# Patient Record
Sex: Female | Born: 1970 | Race: White | Hispanic: No | Marital: Married | Smoking: Never smoker
Health system: Southern US, Community
[De-identification: ages and names within clinical notes are randomized; demographics above are authoritative.]

## PROBLEM LIST (undated history)

## (undated) DIAGNOSIS — I341 Nonrheumatic mitral (valve) prolapse: Secondary | ICD-10-CM

## (undated) DIAGNOSIS — R112 Nausea with vomiting, unspecified: Secondary | ICD-10-CM

## (undated) DIAGNOSIS — D6861 Antiphospholipid syndrome: Principal | ICD-10-CM

## (undated) DIAGNOSIS — Z9889 Other specified postprocedural states: Secondary | ICD-10-CM

## (undated) DIAGNOSIS — F32A Depression, unspecified: Secondary | ICD-10-CM

## (undated) DIAGNOSIS — F329 Major depressive disorder, single episode, unspecified: Secondary | ICD-10-CM

## (undated) DIAGNOSIS — G43909 Migraine, unspecified, not intractable, without status migrainosus: Secondary | ICD-10-CM

## (undated) HISTORY — PX: CAROTID ENDARTERECTOMY: SUR193

## (undated) HISTORY — DX: Antiphospholipid syndrome: D68.61

## (undated) HISTORY — PX: CHOLECYSTECTOMY: SHX55

## (undated) HISTORY — PX: ABDOMINOPLASTY: SUR9

## (undated) HISTORY — PX: VENTRICULOPERITONEAL SHUNT: SHX204

## (undated) HISTORY — DX: Migraine, unspecified, not intractable, without status migrainosus: G43.909

## (undated) HISTORY — PX: WISDOM TOOTH EXTRACTION: SHX21

## (undated) HISTORY — DX: Nonrheumatic mitral (valve) prolapse: I34.1

## (undated) HISTORY — PX: RHINOPLASTY: SUR1284

---

## 2001-05-19 ENCOUNTER — Other Ambulatory Visit: Admission: RE | Admit: 2001-05-19 | Discharge: 2001-05-19 | Payer: Self-pay | Admitting: Obstetrics & Gynecology

## 2002-02-21 ENCOUNTER — Inpatient Hospital Stay (HOSPITAL_COMMUNITY): Admission: AD | Admit: 2002-02-21 | Discharge: 2002-02-21 | Payer: Self-pay | Admitting: Obstetrics and Gynecology

## 2002-02-22 ENCOUNTER — Inpatient Hospital Stay (HOSPITAL_COMMUNITY): Admission: AD | Admit: 2002-02-22 | Discharge: 2002-02-25 | Payer: Self-pay | Admitting: Family Medicine

## 2002-03-24 ENCOUNTER — Other Ambulatory Visit: Admission: RE | Admit: 2002-03-24 | Discharge: 2002-03-24 | Payer: Self-pay | Admitting: Obstetrics and Gynecology

## 2002-10-05 ENCOUNTER — Encounter: Admission: RE | Admit: 2002-10-05 | Discharge: 2002-10-05 | Payer: Self-pay | Admitting: Obstetrics and Gynecology

## 2002-10-05 ENCOUNTER — Encounter: Payer: Self-pay | Admitting: Obstetrics and Gynecology

## 2003-05-31 ENCOUNTER — Other Ambulatory Visit: Admission: RE | Admit: 2003-05-31 | Discharge: 2003-05-31 | Payer: Self-pay | Admitting: Obstetrics and Gynecology

## 2004-07-18 ENCOUNTER — Emergency Department (HOSPITAL_COMMUNITY): Admission: EM | Admit: 2004-07-18 | Discharge: 2004-07-18 | Payer: Self-pay | Admitting: Family Medicine

## 2004-11-06 ENCOUNTER — Other Ambulatory Visit: Admission: RE | Admit: 2004-11-06 | Discharge: 2004-11-06 | Payer: Self-pay | Admitting: Obstetrics and Gynecology

## 2005-12-07 ENCOUNTER — Ambulatory Visit (HOSPITAL_COMMUNITY): Admission: RE | Admit: 2005-12-07 | Discharge: 2005-12-07 | Payer: Self-pay | Admitting: Gastroenterology

## 2005-12-31 ENCOUNTER — Ambulatory Visit (HOSPITAL_COMMUNITY): Admission: RE | Admit: 2005-12-31 | Discharge: 2005-12-31 | Payer: Self-pay | Admitting: Gastroenterology

## 2006-03-11 ENCOUNTER — Ambulatory Visit (HOSPITAL_COMMUNITY): Admission: RE | Admit: 2006-03-11 | Discharge: 2006-03-11 | Payer: Self-pay | Admitting: Gastroenterology

## 2006-06-13 ENCOUNTER — Emergency Department (HOSPITAL_COMMUNITY): Admission: EM | Admit: 2006-06-13 | Discharge: 2006-06-13 | Payer: Self-pay | Admitting: Emergency Medicine

## 2006-11-20 ENCOUNTER — Ambulatory Visit (HOSPITAL_COMMUNITY): Admission: RE | Admit: 2006-11-20 | Discharge: 2006-11-20 | Payer: Self-pay | Admitting: *Deleted

## 2006-11-20 ENCOUNTER — Encounter (INDEPENDENT_AMBULATORY_CARE_PROVIDER_SITE_OTHER): Payer: Self-pay | Admitting: *Deleted

## 2006-11-29 ENCOUNTER — Emergency Department (HOSPITAL_COMMUNITY): Admission: EM | Admit: 2006-11-29 | Discharge: 2006-11-29 | Payer: Self-pay | Admitting: Emergency Medicine

## 2007-05-28 ENCOUNTER — Ambulatory Visit: Payer: Self-pay | Admitting: Internal Medicine

## 2007-06-16 ENCOUNTER — Ambulatory Visit: Payer: Self-pay

## 2007-06-16 ENCOUNTER — Encounter: Payer: Self-pay | Admitting: Internal Medicine

## 2007-07-07 ENCOUNTER — Encounter: Payer: Self-pay | Admitting: Pulmonary Disease

## 2007-07-14 ENCOUNTER — Ambulatory Visit: Payer: Self-pay | Admitting: Pulmonary Disease

## 2007-07-14 ENCOUNTER — Encounter: Payer: Self-pay | Admitting: Pulmonary Disease

## 2007-07-14 DIAGNOSIS — G4710 Hypersomnia, unspecified: Secondary | ICD-10-CM | POA: Insufficient documentation

## 2007-08-01 ENCOUNTER — Ambulatory Visit (HOSPITAL_BASED_OUTPATIENT_CLINIC_OR_DEPARTMENT_OTHER): Admission: RE | Admit: 2007-08-01 | Discharge: 2007-08-01 | Payer: Self-pay | Admitting: Pulmonary Disease

## 2007-08-06 ENCOUNTER — Ambulatory Visit: Payer: Self-pay | Admitting: Pulmonary Disease

## 2007-09-24 ENCOUNTER — Telehealth: Payer: Self-pay | Admitting: Pulmonary Disease

## 2008-05-02 ENCOUNTER — Emergency Department (HOSPITAL_COMMUNITY): Admission: EM | Admit: 2008-05-02 | Discharge: 2008-05-02 | Payer: Self-pay | Admitting: Emergency Medicine

## 2008-05-10 ENCOUNTER — Ambulatory Visit: Payer: Self-pay | Admitting: Sports Medicine

## 2008-05-10 DIAGNOSIS — M79609 Pain in unspecified limb: Secondary | ICD-10-CM

## 2008-05-10 DIAGNOSIS — M21619 Bunion of unspecified foot: Secondary | ICD-10-CM

## 2008-05-10 DIAGNOSIS — M25569 Pain in unspecified knee: Secondary | ICD-10-CM

## 2008-08-05 IMAGING — CR DG ABDOMEN ACUTE W/ 1V CHEST
3 series · 3 of 3 positions shown · non-contrast
Comparison: None.

CLINICAL DATA: One week status post cholecystectomy. Right upper quadrant and
chest pain.

[w chest pa *]
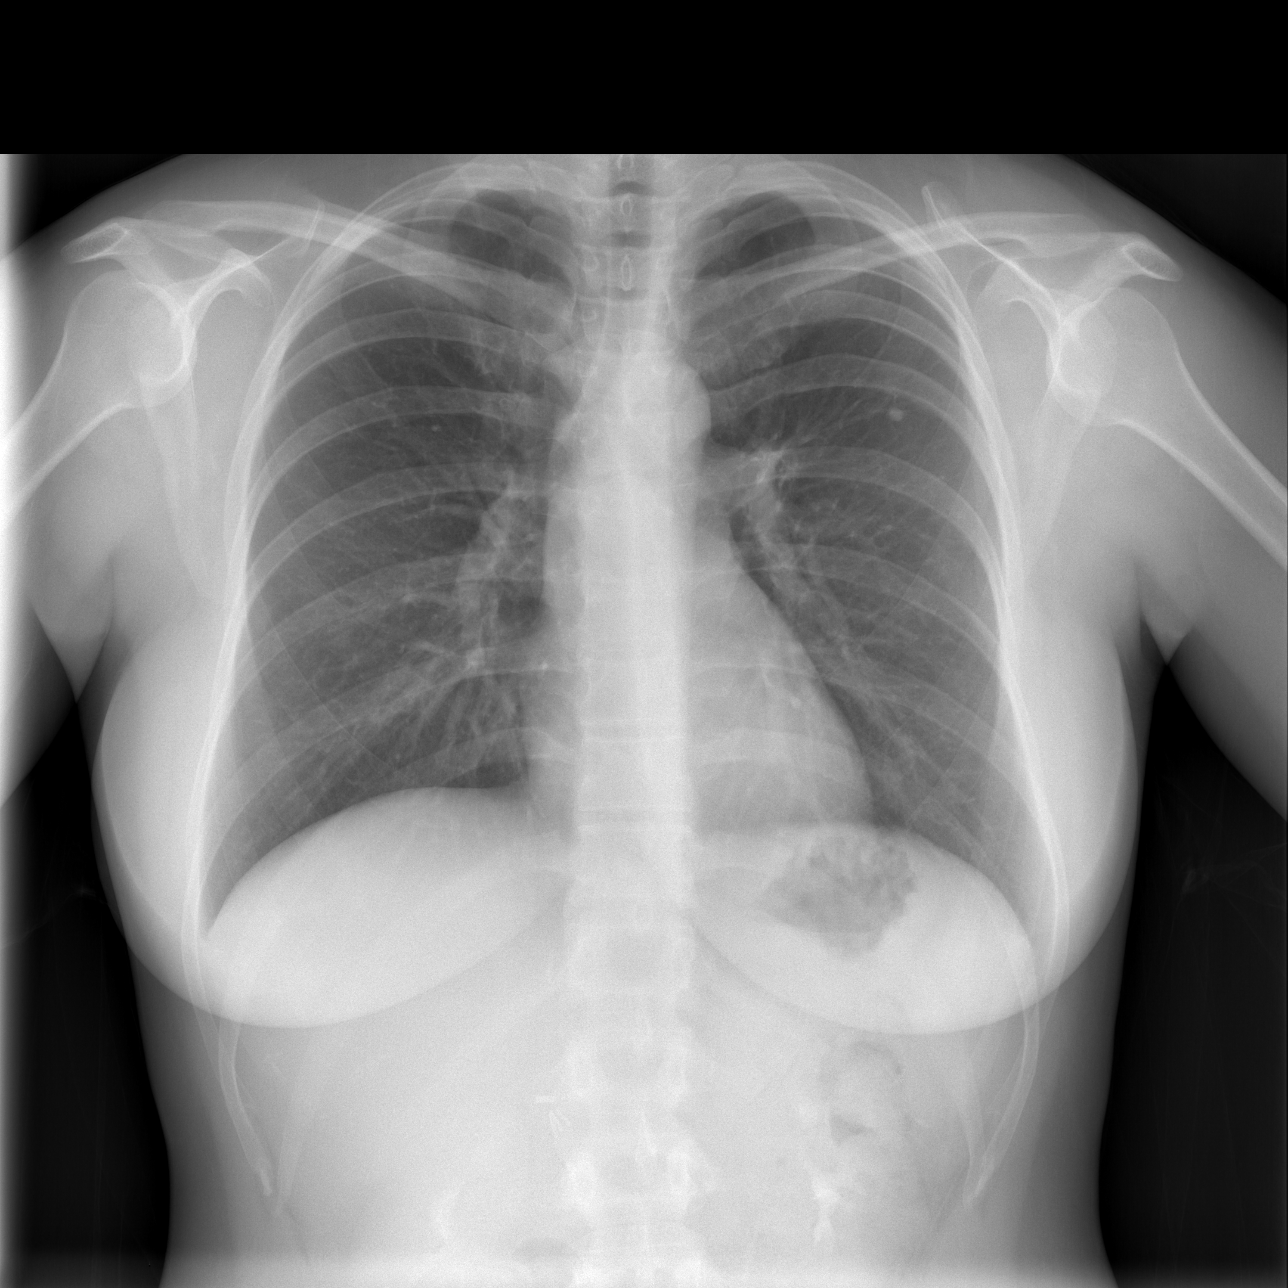

[w abdomen upright *]
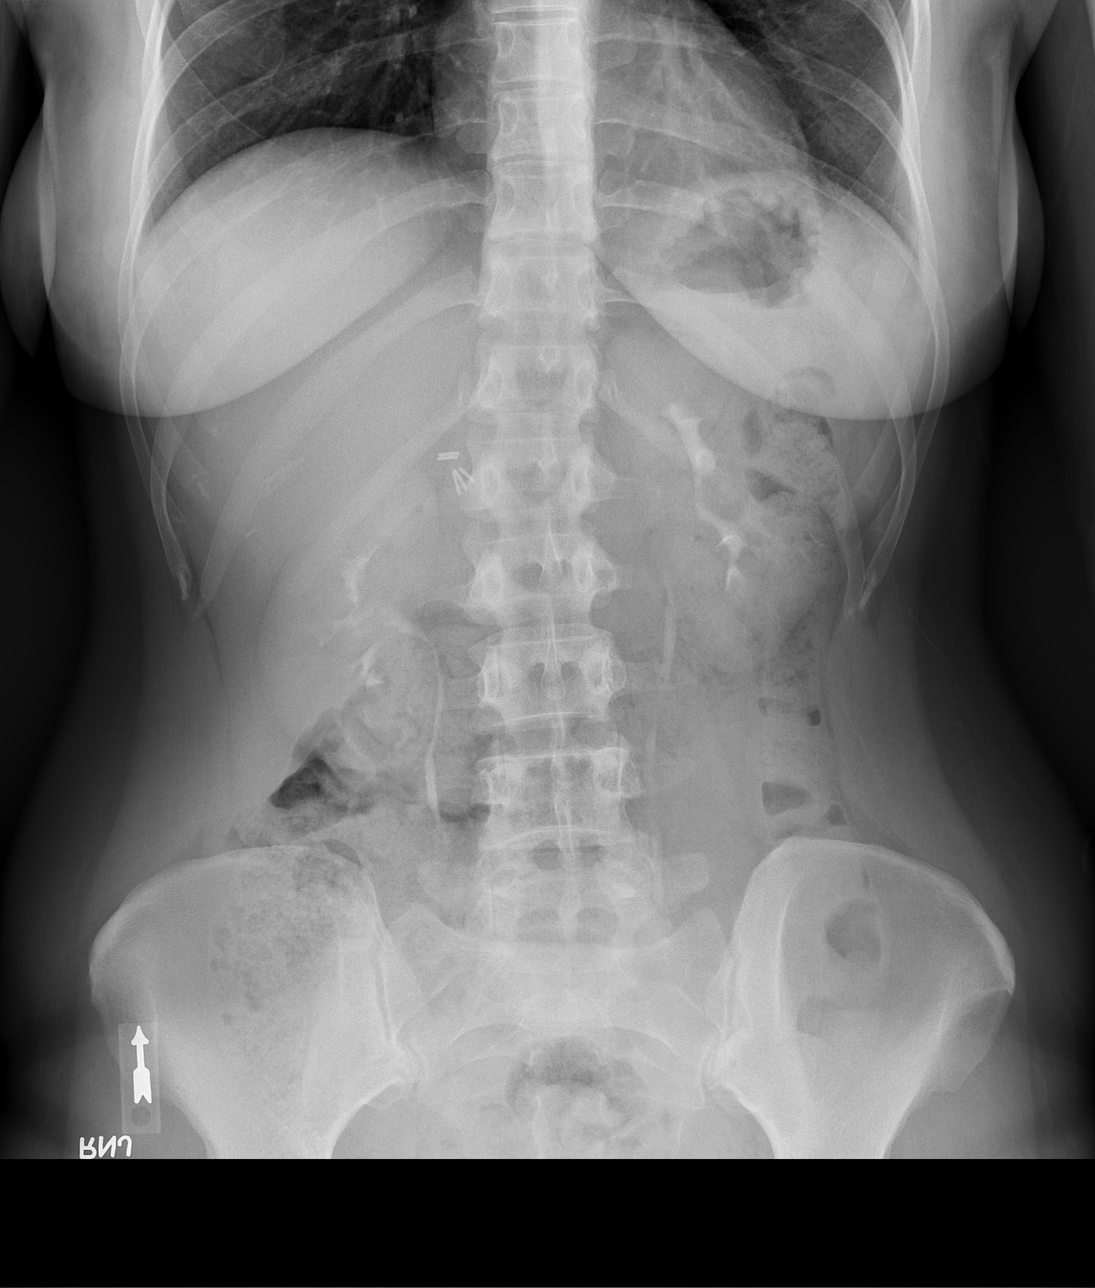

[t abdomen supine]
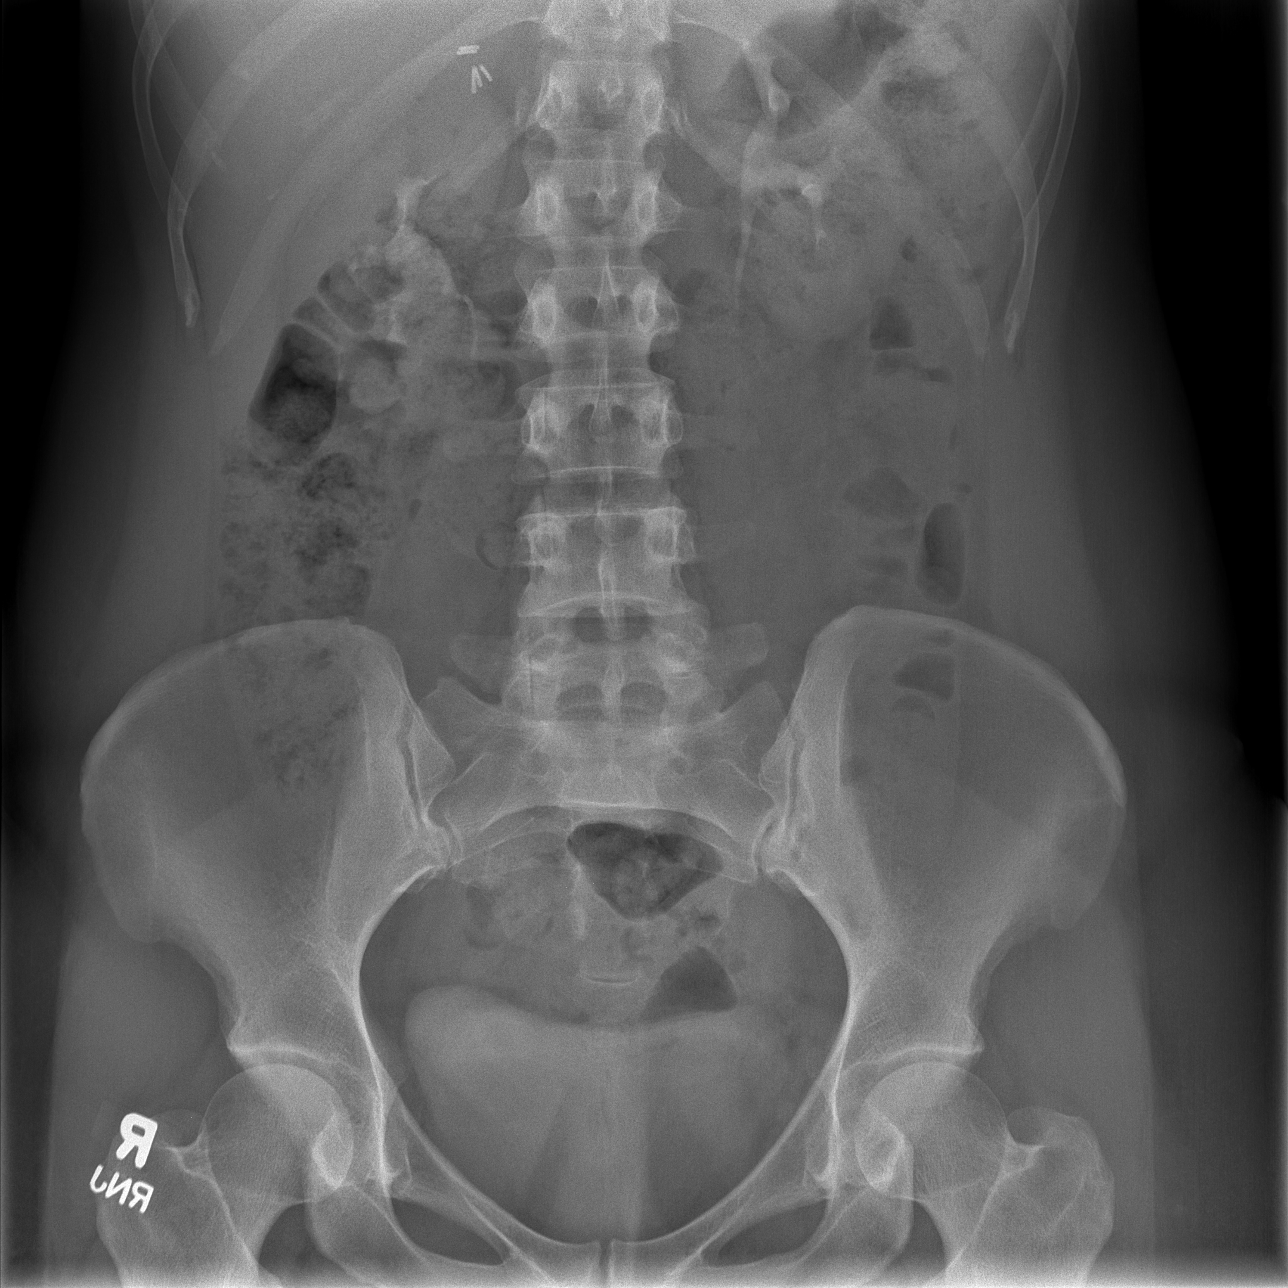

[3 of 3 positions shown; findings below may reference images not displayed]

ABDOMEN SERIES - 2 VIEW AND CHEST - 1 VIEW:

The lungs are clear without focal infiltrate, edema or pleural effusion.
Calcified granuloma is noted in the left upper lobe. The cardiopericardial
silhouette is within normal limits for size. Imaged bony structures of the
thorax are intact.

Supine and upright views of the abdomen without evidence of intraperitoneal free
air. Bowel gas pattern is nonspecific. Surgical clips in the right upper
quadrant consistent with cholecystectomy. Excreted contrast seen in the kidneys
bilaterally with accumulation in the urinary bladder.
IMPRESSION: No acute cardiopulmonary process.

Nonspecific bowel gas pattern.

## 2010-01-30 ENCOUNTER — Emergency Department (HOSPITAL_COMMUNITY): Admission: EM | Admit: 2010-01-30 | Discharge: 2010-01-30 | Payer: Self-pay | Admitting: Family Medicine

## 2010-05-07 LAB — CONVERTED CEMR LAB: Free T4: 0.7 ng/dL (ref 0.6–1.6)

## 2010-06-21 LAB — POCT I-STAT, CHEM 8
Calcium, Ion: 1.12 mmol/L (ref 1.12–1.32)
Creatinine, Ser: 0.9 mg/dL (ref 0.4–1.2)
Hemoglobin: 15.6 g/dL — ABNORMAL HIGH (ref 12.0–15.0)
Sodium: 136 mEq/L (ref 135–145)
TCO2: 23 mmol/L (ref 0–100)

## 2010-06-21 LAB — POCT RAPID STREP A (OFFICE): Streptococcus, Group A Screen (Direct): POSITIVE — AB

## 2010-07-24 LAB — DIFFERENTIAL
Basophils Absolute: 0 10*3/uL (ref 0.0–0.1)
Basophils Relative: 0 % (ref 0–1)
Lymphocytes Relative: 10 % — ABNORMAL LOW (ref 12–46)
Monocytes Absolute: 0.2 10*3/uL (ref 0.1–1.0)
Neutro Abs: 4.4 10*3/uL (ref 1.7–7.7)
Neutrophils Relative %: 87 % — ABNORMAL HIGH (ref 43–77)

## 2010-07-24 LAB — BASIC METABOLIC PANEL
Calcium: 8.8 mg/dL (ref 8.4–10.5)
Creatinine, Ser: 0.7 mg/dL (ref 0.4–1.2)
GFR calc Af Amer: >60 mL/min (ref >60)
GFR calc non Af Amer: >60 mL/min (ref >60)
Glucose, Bld: 104 mg/dL — ABNORMAL HIGH (ref 70–99)
Sodium: 138 mEq/L (ref 135–145)

## 2010-07-24 LAB — POCT CARDIAC MARKERS
CKMB, poc: <1 ng/mL — ABNORMAL LOW (ref 1.0–8.0)
Myoglobin, poc: 46.2 ng/mL (ref 12–200)

## 2010-07-24 LAB — CBC
Hemoglobin: 13.9 g/dL (ref 12.0–15.0)
RDW: 12.9 % (ref 11.5–15.5)

## 2010-07-24 LAB — D-DIMER, QUANTITATIVE: D-Dimer, Quant: 0.39 ug/mL-FEU (ref 0.00–0.48)

## 2010-08-22 NOTE — Op Note (Signed)
Carol Mckenzie, Carol Mckenzie                 ACCOUNT NO.:  [id number]   MEDICAL RECORD NO.:  [id number]          PATIENT TYPE:  AMB   LOCATION:  DAY                          FACILITY:  [facility]   PHYSICIAN:  Alfonse Ras, MD   DATE OF BIRTH:  [birth year]   DATE OF PROCEDURE:  11/20/2006  DATE OF DISCHARGE:                               OPERATIVE REPORT   PREOPERATIVE DIAGNOSIS:  Biliary dyskinesia   POSTOPERATIVE DIAGNOSIS:  Biliary dyskinesia, with normal cholangiogram.   PROCEDURE:  Laparoscopic cholecystectomy with intraoperative  cholangiogram.   SURGEON:  Baruch Merl, M.D.   ASSISTANT:  Lorelee New, M.D.   ANESTHESIA:  General.   DESCRIPTION:  The patient was taken to operating room, placed in supine  position.  After adequate general anesthesia was induced using  endotracheal tube, the abdomen was prepped and draped normal sterile  fashion.  Using a transverse infraumbilical incision I dissected down to  the fascia.  This was opened vertically.  A 0 Vicryl pursestring suture  was placed around the fascial defect.  Hassan trocar was placed in the  abdomen and pneumoperitoneum was obtained.  Under direct vision along  the 11 mm trocar was placed in subxiphoid position and two 5 mm ports  were placed in the right abdomen.  Dr. Earlene Plater then retracted the  gallbladder cephalad and laterally until I obtained a critical view of a  very small cystic duct.  It was dissected and clipped proximally.  A  ductotomy was created and Reddick catheter was too large to place down  through the duct so a Cook catheter was placed and cholangiogram was  performed.  This shows free flow into the duodenum, normal filling of  the common bile duct, normal filling of both right and left hepatic  ducts.  No defects.  The clips were removed.  The cystic duct was triply  clipped and divided.  Cystic artery was triply clipped and divided.  Gallbladder was taken off the gallbladder bed using Bovie  electrocautery  and placed in EndoCatch bag.  There was some bleeding from the  gallbladder bed which was controlled with Bovie electrocautery, Surgicel  and FloSeal.  The right upper quadrant was copiously irrigated.  Adequate hemostasis was assured.  The trocars were removed.  The  infraumbilical fascial defect was closed with the 0 Vicryl pursestring  sutures.  Skin incisions were closed with subcuticular 4-0 Monocryl.  Steri-Strips and sterile dressings were applied.  The patient tolerated  the procedure well, went to PACU in good condition.      Alfonse Ras, MD  Electronically Signed     KRE/MEDQ  D:  11/20/2006  T:  11/21/2006  Job:  [id number]

## 2010-08-22 NOTE — Assessment & Plan Note (Signed)
[facility]                            CARDIOLOGY OFFICE NOTE   Carol Mckenzie, Carol Mckenzie                        MRN:          [id number]  DATE:05/28/2007                            DOB:          11-26-1970    REASON FOR EVALUATION:  Palpitations.   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  She has a history of mitral valve prolapse  and palpitations, as well as gallstones, status post cholecystectomy  last year.   Carol Mckenzie has a history of palpitations, which really have never been  formally worked up.  These happened a few years ago, but resolved.  About two weeks ago, she was having severe episodes of palpitations, she  would have five to ten episodes a day.  These would last a few minutes  and then resolve.  She would feel a little bit funny, but no chest pain,  but certainly exercise tolerance was diminished at the time.  She took  her pulse and found it to be irregular and somewhat fast, but did not  get an EKG or have a monitor placed.  She denies any inciting triggers.  She drinks one or two cups of coffee a day and this has not changed.   Fortunately, she is back to baseline and really not having any  palpitations currently.  She denies any fatigue or other problems.   REVIEW OF SYSTEMS:  Negative, except for HPI and problem list.   PROBLEM LIST:  1. History of palpitations.  2. History of mitral valve prolapse.  3. Gallstones, status post cholecystectomy.   CURRENT MEDICATIONS:  1. Yasmin birth control pill.  2. Nexium 40 a day.  3. Fluoxetine 20 mg a day.   ALLERGIES:  No reported drug allergies.   SOCIAL HISTORY:  She lives in [place].  She is married, has two kids.  She is a Armed forces operational officer.  No tobacco.  Social alcohol.   FAMILY HISTORY:  Notable for Hashimoto's thyroiditis in her father, who  has passed away.  Mother is healthy.   PHYSICAL EXAM:  She is well-appearing, in no  acute distress.  Blood pressure is 104/72, heart rate is 56, weight is 147.  HEENT:  Normal.  NECK:  Supple.  There is no JVD.  Carotids are 2+ bilaterally, without  any bruits.  There is no lymphadenopathy or thyromegaly.  CARDIAC:  PMI is nondisplaced.  She has regular rate and rhythm.  No  gallops.  She has a 2/6 systolic ejection murmur at the apex, consistent  with mitral regurgitation.  There may be a soft midsystolic click.  LUNGS:  Clear.  ABDOMEN:  Soft, nontender, nondistended.  There is no  hepatosplenomegaly, no bruits, no masses.  Good bowel sounds.  EXTREMITIES:  Warm with no cyanosis, clubbing or edema.  No rash.  NEUROLOGIC:  Alert and oriented times three.  Cranial nerves II through  XII are intact.  Moves all four extremities without difficulty.  Affect  is pleasant.   EKG shows sinus arrhythmia with a rate of 56.  There  are no STT-wave  abnormalities, no pre-excitation.  QT interval is normal.   ASSESSMENT AND PLAN:  Palpitations.  I suspect these are either PACs or  PVCs.  However, I cannot rule out SVT, especially in light of her  history of mitral valve prolapse.  We will go ahead and check a thyroid  panel and an echocardiogram.  If the symptoms recur, she will need a  monitor placed.  We have given her Lopressor 25 mg p.r.n. to take for  symptomatic control.  She will call me if she has more episodes.  We  will see her back on a p.r.n. basis.     Bevelyn Buckles. Bensimhon, MD  Electronically Signed    DRB/MedQ  DD: 05/28/2007  DT: 05/28/2007  Job #: [id number]

## 2010-08-25 NOTE — Procedures (Signed)
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   EPWORTH SLEEPINESS SCORE:  Was 13/24.  V-MI is 2.9.  Weight of 15  pounds, height 64 inches.  Neck size 13.5 inches.   MEDICATIONS:  At the time of study was fluoxetine.   This overnight polysomnogram was performed with a sleep technologist in  attendance.  Lights out was at 2125 p.m.  EEG, EOG, EMG, EKG and  respiratory data were recorded.  Sleep stages, arousal, leg movements  and respiratory pattern were reasonably scored according to criteria  laid out by the American Academy of Sleep medicine.   Lights out was at 2125 p.m.  Total sleep time was 385.5 minutes with a  sleep period time of 411.5 minutes, aiding to sleep maintained  insufficiency of 91.3%.  Sleep latency was 39 minutes and latency to REM  sleep was 170 minutes.  Wake after sleep onset was 26 minutes.  Sleep  stages as the person's did a total sleep time was N1 13.1% and 269.4%  and 36.7% and REM sleep 10.8% (41.5 minutes).  The longest period of REM  sleep was around 3 a.m.  No supine sleep was noted.   AROUSAL DATA:  There were a total of 84 arousals leading to an arousal  index of 13.1 events per hour.  Of these, 83 were spontaneous and one  was associated with limb  movement.   RESPIRATORY DATA:  There were 0 obstructive apneas, one central apnea, 0  mixed apnea, 0 hypopneas leading to an apnea/hypopnea index of 0.2  events per hour.  Four RERAs were noted leading to an RDI of 0.8 events  per hour.  The central apnea lasted for 11.6 seconds.   LIMB MOVEMENT DATA:  There were no significant periodic limb movements  noted.   OXYGEN SATURATION DATA:  No significant desaturations were noted.  The  lowest oxygen saturation was 93% during non-REM sleep.   CARDIAC DATA:  The lowest heart rate was 30.5 beats per minute during  non-REM sleep.  No arrhythmias were noted.  No behavioral disorder was  noted.   DISCUSSION:  No supine sleep was noted, but the study seems otherwise  adequate for interpretation.  No cause was found for the frequent  arousal.  On review of arousals, some were associated with slight  increase in chin EMG preceding -this was not very convincing for  bruxism.   IMPRESSIONS:  1. No evidence of sleep disorder.  2. No evidence of periodic limb movements or cardiac arrhythmias or      behavioral disturbance during sleep.  3. Spontaneous arousals causing sleep fragmentation.  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  4. Prolonged REM sleep latency, likely related to fluoxetine.   RECOMMENDATIONS:  1. Her excessive data and somnolence remains unexplained on the basis      of this sleep study.  In the absence of cataplexy and delayed REM      latency in this study, narcolepsy seems unlikely.  Insufficient      sleep remains a possibility, and I have encouraged her to submit a      sleep diary.  2. Trial of mouth guard for bruxism was discussed .      Oretha Milch, MD  Electronically Signed     RVA/MEDQ  D:  08/06/2007 13:00:44  T:  08/06/2007 13:29:28  Job:  [id number]   cc:   Christianne Dolin, MD

## 2010-08-25 NOTE — Op Note (Signed)
Carol Mckenzie, Carol Mckenzie                 ACCOUNT NO.:  [id number]   MEDICAL RECORD NO.:  [id number]          PATIENT TYPE:  AMB   LOCATION:  ENDO                         FACILITY:  [facility]   PHYSICIAN:  Anselmo Rod, M.D.  DATE OF BIRTH:  1970-04-26   DATE OF PROCEDURE:  03/11/2006  DATE OF DISCHARGE:                               OPERATIVE REPORT   PROCEDURE PERFORMED:  Screening colonoscopy.   ENDOSCOPIST:  Anselmo Rod, M.D.   INSTRUMENT USED:  Olympus pediatric video colonoscope.   INDICATIONS FOR PROCEDURE:  40 year old white female with a history of  change in bowel habits and bright red bleeding per rectum, undergoing  screening colonoscopy to rule out colonic polyps, masses, etc.   PREPROCEDURE PREPARATION:  Informed consent was procured from the  patient.  The patient fasted for 4 hours prior to the procedure after  being prepped with 20 Osmoprep pills the night of and 12 Osmoprep pills  the morning of the procedure.  The risks and benefits of the procedure  including a 10% miss rate of cancer and polyp were discussed with the  patient as well.   PREPROCEDURE PHYSICAL:  The patient had stable vital signs.  NECK:  Supple.  Chest clear to auscultation.  S1, S2 regular.  Abdomen soft  with normal bowel sounds.   DESCRIPTION OF PROCEDURE:  The patient was placed in left lateral  decubitus position and sedated with 125 mcg of fentanyl and 10 mg of  Versed given intravenously in slow incremental doses.  Once the patient  was adequately sedated and maintained on low-flow oxygen and continuous  cardiac monitoring the Olympus video colonoscope was advanced from the  rectum to cecum.  The appendiceal orifice and cecal valve were clearly  visualized photographed.  No masses, polyps, erosions, ulcerations or  diverticula seen.  There was some stool in the dependent areas of the  colon especially in the right side and therefore multiple washes were  done.  Small internal hemorrhoids  were seen on retroflexion the rectum.  The patient tolerated the procedure well without complication.   IMPRESSION:  Normal colonoscopy of the terminal ileum except for small  nonbleeding internal hemorrhoids.  No masses, polyps, erosions,  ulcerations or diverticula seen.   RECOMMENDATIONS:  1. Continue high fiber diet with liberal fluid intake.  2. Repeat colonoscopy at age 65 unless the patient has any abnormal      symptoms in the interim.  3. Outpatient follow-up as need arises in the future.      Anselmo Rod, M.D.  Electronically Signed     JNM/MEDQ  D:  03/11/2006  T:  03/12/2006  Job:  [id number]   cc:   Guy Sandifer. Henderson Cloud, M.D.

## 2011-01-19 LAB — COMPREHENSIVE METABOLIC PANEL
ALT: 38 — ABNORMAL HIGH
AST: 33
Albumin: 2.9 — ABNORMAL LOW
Calcium: 8.6
Creatinine, Ser: 0.75
GFR calc Af Amer: >60
GFR calc non Af Amer: >60
Sodium: 133 — ABNORMAL LOW
Total Protein: 5.9 — ABNORMAL LOW

## 2011-01-19 LAB — CBC
MCHC: 34.5
MCV: 91.5
Platelets: 273
RBC: 3.78 — ABNORMAL LOW
RDW: 12.4

## 2011-01-19 LAB — DIFFERENTIAL
Eosinophils Absolute: 0.2
Eosinophils Relative: 2
Lymphocytes Relative: 20
Lymphs Abs: 1.5
Monocytes Relative: 11

## 2011-01-22 LAB — HEMOGLOBIN AND HEMATOCRIT, BLOOD
HCT: 38.3
Hemoglobin: 13

## 2011-01-22 LAB — PREGNANCY, URINE: Preg Test, Ur: NEGATIVE

## 2012-01-14 ENCOUNTER — Other Ambulatory Visit: Payer: Self-pay | Admitting: Obstetrics and Gynecology

## 2012-05-05 ENCOUNTER — Encounter (HOSPITAL_COMMUNITY): Payer: Self-pay | Admitting: *Deleted

## 2012-05-05 ENCOUNTER — Other Ambulatory Visit: Payer: Self-pay | Admitting: Obstetrics and Gynecology

## 2012-05-06 ENCOUNTER — Encounter (HOSPITAL_COMMUNITY): Payer: Self-pay | Admitting: Pharmacist

## 2012-05-09 ENCOUNTER — Encounter (HOSPITAL_COMMUNITY)
Admission: RE | Disposition: A | Discharge status: Home / Self Care | Payer: Self-pay | Source: Ambulatory Visit | Attending: Obstetrics and Gynecology

## 2012-05-09 ENCOUNTER — Ambulatory Visit (HOSPITAL_COMMUNITY)
Admission: RE | Admit: 2012-05-09 | Discharge: 2012-05-09 | Disposition: A | Discharge status: Home / Self Care | Payer: 59 | Source: Ambulatory Visit | Attending: Obstetrics and Gynecology | Admitting: Obstetrics and Gynecology

## 2012-05-09 ENCOUNTER — Ambulatory Visit (HOSPITAL_COMMUNITY): Payer: 59 | Admitting: Registered Nurse

## 2012-05-09 ENCOUNTER — Encounter (HOSPITAL_COMMUNITY): Payer: Self-pay | Admitting: Registered Nurse

## 2012-05-09 DIAGNOSIS — O021 Missed abortion: Secondary | ICD-10-CM | POA: Insufficient documentation

## 2012-05-09 HISTORY — DX: Other specified postprocedural states: Z98.890

## 2012-05-09 HISTORY — DX: Depression, unspecified: F32.A

## 2012-05-09 HISTORY — DX: Major depressive disorder, single episode, unspecified: F32.9

## 2012-05-09 HISTORY — PX: DILATION AND EVACUATION: SHX1459

## 2012-05-09 HISTORY — DX: Nonrheumatic mitral (valve) prolapse: I34.1

## 2012-05-09 HISTORY — DX: Nausea with vomiting, unspecified: R11.2

## 2012-05-09 LAB — CBC
Hemoglobin: 13.1 g/dL (ref 12.0–15.0)
MCH: 31.3 pg (ref 26.0–34.0)
Platelets: 228 10*3/uL (ref 150–400)
RBC: 4.18 MIL/uL (ref 3.87–5.11)
WBC: 7.5 10*3/uL (ref 4.0–10.5)

## 2012-05-09 SURGERY — DILATION AND EVACUATION, UTERUS
Anesthesia: Epidural | Wound class: Clean Contaminated

## 2012-05-09 MED ORDER — IBUPROFEN 600 MG PO TABS: 600.0000 mg | ORAL_TABLET | Freq: Four times a day (QID) | ORAL | Status: DC | PRN

## 2012-05-09 MED ORDER — ONDANSETRON HCL 4 MG/2ML IJ SOLN
INTRAMUSCULAR | Status: AC
  Filled 2012-05-09: qty 2

## 2012-05-09 MED ORDER — FENTANYL CITRATE 0.05 MG/ML IJ SOLN
INTRAMUSCULAR | Status: DC | PRN
  Administered 2012-05-09: 50 ug via INTRAVENOUS

## 2012-05-09 MED ORDER — HYDROCODONE-ACETAMINOPHEN 5-500 MG PO TABS: 1.0000 | ORAL_TABLET | ORAL | Status: DC | PRN

## 2012-05-09 MED ORDER — LIDOCAINE HCL 1 % IJ SOLN
INTRAMUSCULAR | Status: DC | PRN
  Administered 2012-05-09: 10 mL

## 2012-05-09 MED ORDER — KETOROLAC TROMETHAMINE 30 MG/ML IJ SOLN
INTRAMUSCULAR | Status: DC | PRN
  Administered 2012-05-09: 30 mg via INTRAVENOUS

## 2012-05-09 MED ORDER — PROPOFOL 10 MG/ML IV EMUL
INTRAVENOUS | Status: AC
  Filled 2012-05-09: qty 40

## 2012-05-09 MED ORDER — KETOROLAC TROMETHAMINE 30 MG/ML IJ SOLN
INTRAMUSCULAR | Status: AC
  Filled 2012-05-09: qty 1

## 2012-05-09 MED ORDER — LACTATED RINGERS IV SOLN
INTRAVENOUS | Status: DC
  Administered 2012-05-09: 09:00:00 via INTRAVENOUS

## 2012-05-09 MED ORDER — LIDOCAINE HCL (CARDIAC) 20 MG/ML IV SOLN
INTRAVENOUS | Status: AC
  Filled 2012-05-09: qty 5

## 2012-05-09 MED ORDER — MIDAZOLAM HCL 5 MG/5ML IJ SOLN
INTRAMUSCULAR | Status: DC | PRN
  Administered 2012-05-09: 2 mg via INTRAVENOUS

## 2012-05-09 MED ORDER — PROPOFOL 10 MG/ML IV EMUL
INTRAVENOUS | Status: DC | PRN
  Administered 2012-05-09 (×2): 20 mg via INTRAVENOUS
  Administered 2012-05-09: 40 mg via INTRAVENOUS
  Administered 2012-05-09 (×3): 20 mg via INTRAVENOUS
  Administered 2012-05-09 (×2): 40 mg via INTRAVENOUS

## 2012-05-09 MED ORDER — GLYCOPYRROLATE 0.2 MG/ML IJ SOLN
INTRAMUSCULAR | Status: DC | PRN
  Administered 2012-05-09: 0.2 mg via INTRAVENOUS

## 2012-05-09 MED ORDER — FENTANYL CITRATE 0.05 MG/ML IJ SOLN
INTRAMUSCULAR | Status: AC
  Filled 2012-05-09: qty 2

## 2012-05-09 MED ORDER — ONDANSETRON HCL 4 MG/2ML IJ SOLN
INTRAMUSCULAR | Status: DC | PRN
  Administered 2012-05-09: 4 mg via INTRAVENOUS

## 2012-05-09 MED ORDER — MIDAZOLAM HCL 2 MG/2ML IJ SOLN
INTRAMUSCULAR | Status: AC
  Filled 2012-05-09: qty 2

## 2012-05-09 MED ORDER — GLYCOPYRROLATE 0.2 MG/ML IJ SOLN
INTRAMUSCULAR | Status: AC
  Filled 2012-05-09: qty 1

## 2012-05-09 MED ORDER — LIDOCAINE HCL (CARDIAC) 20 MG/ML IV SOLN
INTRAVENOUS | Status: DC | PRN
  Administered 2012-05-09: 50 mg via INTRAVENOUS

## 2012-05-09 SURGICAL SUPPLY — 20 items
CATH ROBINSON RED A/P 16FR (CATHETERS) ×2 IMPLANT
CLOTH BEACON ORANGE TIMEOUT ST (SAFETY) ×2 IMPLANT
DECANTER SPIKE VIAL GLASS SM (MISCELLANEOUS) ×2 IMPLANT
DILATOR CANAL MILEX (MISCELLANEOUS) IMPLANT
GLOVE BIO SURGEON STRL SZ7 (GLOVE) ×4 IMPLANT
GLOVE NEODERM STER SZ 7 (GLOVE) ×2 IMPLANT
GOWN STRL REIN XL XLG (GOWN DISPOSABLE) ×4 IMPLANT
KIT BERKELEY 1ST TRIMESTER 3/8 (MISCELLANEOUS) ×2 IMPLANT
NEEDLE SPNL 22GX3.5 QUINCKE BK (NEEDLE) ×2 IMPLANT
NS IRRIG 1000ML POUR BTL (IV SOLUTION) ×2 IMPLANT
PACK VAGINAL MINOR WOMEN LF (CUSTOM PROCEDURE TRAY) ×2 IMPLANT
PAD OB MATERNITY 4.3X12.25 (PERSONAL CARE ITEMS) ×2 IMPLANT
PAD PREP 24X48 CUFFED NSTRL (MISCELLANEOUS) ×2 IMPLANT
SET BERKELEY SUCTION TUBING (SUCTIONS) ×2 IMPLANT
SYR CONTROL 10ML LL (SYRINGE) ×2 IMPLANT
TOWEL OR 17X24 6PK STRL BLUE (TOWEL DISPOSABLE) ×4 IMPLANT
VACURETTE 10 RIGID CVD (CANNULA) IMPLANT
VACURETTE 7MM CVD STRL WRAP (CANNULA) IMPLANT
VACURETTE 8 RIGID CVD (CANNULA) IMPLANT
VACURETTE 9 RIGID CVD (CANNULA) IMPLANT

## 2012-05-09 NOTE — Transfer of Care (Signed)
Immediate Anesthesia Transfer of Care Note  Patient: Carol Mckenzie  Procedure(s) Performed: Procedure(s) (LRB) with comments: DILATATION AND EVACUATION (N/A)  Patient Location: PACU  Anesthesia Type:MAC  Level of Consciousness: awake, alert  and oriented  Airway & Oxygen Therapy: Patient Spontanous Breathing  Post-op Assessment: Report given to PACU RN and Post -op Vital signs reviewed and stable  Post vital signs: Reviewed and stable  Complications: No apparent anesthesia complications

## 2012-05-09 NOTE — Anesthesia Preprocedure Evaluation (Signed)
Anesthesia Evaluation  Patient identified by MRN, date of birth, ID band Patient awake    Reviewed: Allergy & Precautions, H&P , Patient's Chart, lab work & pertinent test results  Airway Mallampati: II TM Distance: >3 FB Neck ROM: full    Dental No notable dental hx. (+) Teeth Intact   Pulmonary neg pulmonary ROS,  breath sounds clear to auscultation  Pulmonary exam normal       Cardiovascular negative cardio ROS  Rhythm:regular Rate:Normal     Neuro/Psych negative neurological ROS  negative psych ROS   GI/Hepatic negative GI ROS, Neg liver ROS,   Endo/Other  negative endocrine ROS  Renal/GU negative Renal ROS  negative genitourinary   Musculoskeletal   Abdominal Normal abdominal exam  (+)   Peds  Hematology negative hematology ROS (+)   Anesthesia Other Findings   Reproductive/Obstetrics (+) Pregnancy                           Anesthesia Physical Anesthesia Plan  ASA: II  Anesthesia Plan: Epidural   Post-op Pain Management:    Induction:   Airway Management Planned:   Additional Equipment:   Intra-op Plan:   Post-operative Plan:   Informed Consent: I have reviewed the patients History and Physical, chart, labs and discussed the procedure including the risks, benefits and alternatives for the proposed anesthesia with the patient or authorized representative who has indicated his/her understanding and acceptance.     Plan Discussed with: Anesthesiologist  Anesthesia Plan Comments:         Anesthesia Quick Evaluation

## 2012-05-09 NOTE — H&P (Signed)
Carol Mckenzie is an 42 y.o. female presents for a Suction D&E for missed abortion  42 yo G3P2002 @ 9 weeks by  LMP presents for suction D&E for a missed abortion.  Pregnancy was a twin gestation that had been grwoing appropriately but a week ago on ultrasound no fetal cardiac activity x 2 was seen.  A follow up ultrasound this week in the office confirmed demise.  Management options were d/w the patient and after careful consideration she chose to proceed with surgical management.  Patient's last menstrual period was 03/05/2012.    Past Medical History  Diagnosis Date  . PONV (postoperative nausea and vomiting)   . Depression   . MVP (mitral valve prolapse)     Does not get pre-treated w/abx - no problem per pt    Past Surgical History  Procedure Date  . Cesarean section   . Cholecystectomy   . Wisdom tooth extraction   . Rhinoplasty   . Abdominoplasty     History reviewed. No pertinent family history.  Social History:  reports that she has never smoked. She has never used smokeless tobacco. She reports that she drinks alcohol. She reports that she does not use illicit drugs.  Allergies:  Allergies  Allergen Reactions  . Other Hives    Reaction to unknown narcotic post C-section    Prescriptions prior to admission  Medication Sig Dispense Refill  . FLUoxetine (PROZAC) 20 MG capsule Take 20 mg by mouth daily.      Marland Kitchen ibuprofen (ADVIL,MOTRIN) 200 MG tablet Take 400 mg by mouth daily as needed. For headache/ pain        ROS: some light spotting  Blood pressure 113/69, pulse 65, temperature 97.9 F (36.6 C), temperature source Oral, resp. rate 18, height 5\' 4"  (1.626 m), weight 65.772 kg (145 lb), last menstrual period 03/05/2012, SpO2 99.00%. Physical Exam  AOx3, NAD Abd soft NT/NT  US findings as above  Results for orders placed during the hospital encounter of 05/09/12 (from the past 24 hour(s))  CBC     Status: Normal   Collection Time   05/09/12  8:06 AM   Component Value Range   WBC 7.5  4.0 - 10.5 K/uL   RBC 4.18  3.87 - 5.11 MIL/uL   Hemoglobin 13.1  12.0 - 15.0 g/dL   HCT 98.1  19.1 - 47.8 %   MCV 92.8  78.0 - 100.0 fL   MCH 31.3  26.0 - 34.0 pg   MCHC 33.8  30.0 - 36.0 g/dL   RDW 29.5  62.1 - 30.8 %   Platelets 228  150 - 400 K/uL    No results found.  Assessment/Plan: 1) Proceed with suction dilation and evacuation for missed ab.  R/B/A reviewed, consent obtained   Carol Mckenzie H. 05/09/2012, 8:47 AM

## 2012-05-09 NOTE — Anesthesia Postprocedure Evaluation (Signed)
  Anesthesia Post-op Note  Patient: Carol Mckenzie  Procedure(s) Performed: Procedure(s) (LRB) with comments: DILATATION AND EVACUATION (N/A)  Patient Location: PACU  Anesthesia Type:MAC  Level of Consciousness: awake, alert  and oriented  Airway and Oxygen Therapy: Patient Spontanous Breathing  Post-op Pain: none  Post-op Assessment: Post-op Vital signs reviewed, Patient's Cardiovascular Status Stable, Respiratory Function Stable, Patent Airway, No signs of Nausea or vomiting and Pain level controlled  Post-op Vital Signs: Reviewed and stable  Complications: No apparent anesthesia complications

## 2012-05-12 ENCOUNTER — Encounter (HOSPITAL_COMMUNITY): Payer: Self-pay | Admitting: Obstetrics and Gynecology

## 2012-05-15 NOTE — Op Note (Signed)
Pre-Operative Diagnosis: 1) 8 Week missed abortion Postoperative Diagnosis: Same Procedure: Suction Dilation and Evacuation Surgeon: Dr. Waynard Reeds Assistant: None Operative Findings: 8 week size uterus, products of conception.  Bedside ultrasound after procedure confirmed complete evacuation. Specimen: Products of conception EBL: Minimal  Carol Mckenzie Is a 42 year old gravida 3 para 2002 who presents for definitive surgical management for a 8 week missed abortion. Please see the patient's history and physical for complete details of the history. Management options were discussed with the patient. R/B/A reviewed. Following appropriate informed consent was taken to the operating room. The patient was appropriately identified during a time out procedure. MAC anesthesia was administered and the patient was placed in the dorsal lithotomy position. The patient was prepped and draped in the normal sterile fashion. A speculum was placed into the vagina, a single-tooth tenaculum was placed on the anterior lip of the cervix, and 10 cc of 1% lidocaine was administered in a paracervical fashion. The cervix was serially dilated with Hank dilators. A #8 suction curet was then passed to the fundus, the vacuum was engaged, and 3 suction passes were performed with a curette. A Sharp curettage was performed and a gritty texture was noted. A final suction pass was performed with minimal results. A bedside ultrasound was performed and confirmed complete evacuation of the uterus. This completed the procedure. The patient tolerated the procedure well was brought to the recovery room in stable condition for the procedure. All sponge and needle counts correct x2.

## 2012-05-22 LAB — CHROMOSOME STD, POC(TISSUE)-NCBH

## 2013-12-21 ENCOUNTER — Encounter: Payer: Self-pay | Admitting: Oncology

## 2013-12-21 ENCOUNTER — Ambulatory Visit (INDEPENDENT_AMBULATORY_CARE_PROVIDER_SITE_OTHER): Payer: BC Managed Care – PPO | Admitting: Oncology

## 2013-12-21 VITALS — BP 108/63 | HR 64 | Temp 98.5°F | Ht 64.0 in | Wt 148.4 lb

## 2013-12-21 DIAGNOSIS — I059 Rheumatic mitral valve disease, unspecified: Secondary | ICD-10-CM | POA: Diagnosis not present

## 2013-12-21 DIAGNOSIS — D6861 Antiphospholipid syndrome: Secondary | ICD-10-CM

## 2013-12-21 DIAGNOSIS — D6859 Other primary thrombophilia: Secondary | ICD-10-CM

## 2013-12-21 DIAGNOSIS — I341 Nonrheumatic mitral (valve) prolapse: Secondary | ICD-10-CM

## 2013-12-21 DIAGNOSIS — O0001 Abdominal pregnancy with intrauterine pregnancy: Secondary | ICD-10-CM | POA: Diagnosis not present

## 2013-12-21 HISTORY — DX: Antiphospholipid syndrome: D68.61

## 2013-12-21 HISTORY — DX: Nonrheumatic mitral (valve) prolapse: I34.1

## 2013-12-21 LAB — CBC WITH DIFFERENTIAL/PLATELET
BASOS PCT: 0 % (ref 0–1)
Basophils Absolute: 0 10*3/uL (ref 0.0–0.1)
EOS ABS: 0.1 10*3/uL (ref 0.0–0.7)
Eosinophils Relative: 1 % (ref 0–5)
HCT: 38.1 % (ref 36.0–46.0)
HEMOGLOBIN: 13 g/dL (ref 12.0–15.0)
LYMPHS ABS: 2 10*3/uL (ref 0.7–4.0)
Lymphocytes Relative: 29 % (ref 12–46)
MCH: 32.5 pg (ref 26.0–34.0)
MCHC: 34.1 g/dL (ref 30.0–36.0)
MCV: 95.3 fL (ref 78.0–100.0)
MONOS PCT: 11 % (ref 3–12)
Monocytes Absolute: 0.8 10*3/uL (ref 0.1–1.0)
NEUTROS ABS: 4.1 10*3/uL (ref 1.7–7.7)
NEUTROS PCT: 59 % (ref 43–77)
PLATELETS: 278 10*3/uL (ref 150–400)
RBC: 4 MIL/uL (ref 3.87–5.11)
RDW: 12.6 % (ref 11.5–15.5)
WBC: 6.9 10*3/uL (ref 4.0–10.5)

## 2013-12-21 LAB — COMPREHENSIVE METABOLIC PANEL
ALBUMIN: 3.7 g/dL (ref 3.5–5.2)
ALK PHOS: 55 U/L (ref 39–117)
ALT: 17 U/L (ref 0–35)
AST: 19 U/L (ref 0–37)
BILIRUBIN TOTAL: 0.2 mg/dL — AB (ref 0.2–1.2)
BUN: 13 mg/dL (ref 6–23)
CO2: 25 meq/L (ref 19–32)
Calcium: 9.2 mg/dL (ref 8.4–10.5)
Chloride: 100 mEq/L (ref 96–112)
Creat: 1.17 mg/dL — ABNORMAL HIGH (ref 0.50–1.10)
GLUCOSE: 89 mg/dL (ref 70–99)
POTASSIUM: 4.3 meq/L (ref 3.5–5.3)
SODIUM: 138 meq/L (ref 135–145)
TOTAL PROTEIN: 6.8 g/dL (ref 6.0–8.3)

## 2013-12-21 LAB — PROTIME-INR
INR: 1.05 (ref <1.50)
Prothrombin Time: 13.7 seconds (ref 11.6–15.2)

## 2013-12-21 LAB — LACTATE DEHYDROGENASE: LDH: 131 U/L (ref 94–250)

## 2013-12-21 LAB — APTT: aPTT: 34 seconds (ref 24–37)

## 2013-12-21 NOTE — Patient Instructions (Signed)
To lab today Call for any problems Return visit 9:45 on May 25, 2014

## 2013-12-21 NOTE — Progress Notes (Addendum)
Patient ID: Carol Mckenzie, female   DOB: 09-26-1970, 43 y.o.   MRN: [id number] New Patient Hematology   Carol Mckenzie [id number] 1970/07/16 42 y.o. 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 She has asymptomatic mitral valve prolapse. Migraine headaches. She was first pregnant in 2001. She developed oligohydramnios and required a cesarean section. Second pregnancy in 2003. She took aspirin during the pregnancy which proceeded to term and she delivered vaginally. She did not get pregnant again for 10 more years until 2013 and then she had an unfortunate series of early miscarriages with her third, fourth, fifth, and sixth pregnancies,  all at approximately 8-9 weeks. She was evaluated by a fertility expert:  doctor  [place]. She had negative lupus anticoagulant testing x2 but did have reproducible elevations of anticardiolipin and anti-beta 2 glycoprotein 1 antibodies against IgM. On 12/15/2012 anticardiolipin antibody 142 units normal less than 11, anti-beta 2 glycoprotein 1 antibody 146 units normal less than 20. Additional laboratory studies done by rheumatology, Dr. Amil Amen, 12/23/2012, with a normal chemistry profile, hemoglobin 13.2, platelet count 258,000, negative ANA, negative rheumatology profile including anti-DNA double-stranded, RNP antibodies, Smith antibodies, anti-scleroderma and Sjogren's antibodies. TSH was normal 1.69 but there were detectable antibodies against thyroid peroxidase 76.7 units normal less than 35. She has no clinical symptoms of thyroid disease. She has no history of any thrombotic events. She gets occasional arthralgias in her fingers but otherwise no signs or symptoms of a collagen vascular disorder. Her  mother did have blood clots when she was in her 43s. Her brother age 54 is healthy. Her daughter age 36 and son age 37 are healthy.  Tomorrow she will be at 8 weeks in this her seventh pregnancy. She was started on Lovenox 40 mg subcutaneous daily and aspirin 81 mg daily.   PMH: Past Medical History  Diagnosis Date  . PONV (postoperative nausea and vomiting)   . Depression   . MVP (mitral valve prolapse)     Does not get pre-treated w/abx - no problem per pt  . 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 Mitral valve prolapse 12/21/2013   no history of hypertension, ulcers, diabetes, hepatitis, yellow jaundice, mononucleosis, seizure, stroke, inflammatory arthritis.  Past Surgical History  Procedure Laterality Date  . Cesarean section    . Cholecystectomy    . Wisdom tooth extraction    . Rhinoplasty    . Abdominoplasty    . Dilation and evacuation  05/09/2012    Procedure: DILATATION AND EVACUATION;  Surgeon: Farrel Gobble. Harrington Challenger, MD;  Location: [facility];  Service: Gynecology;  Laterality: N/A;    Allergies: Allergies  Allergen Reactions  . Other Hives    Reaction to unknown narcotic post C-section    Medications: Current outpatient prescriptions:aspirin EC 81 MG tablet, Take 81 mg by mouth daily., Disp: , Rfl: ;  enoxaparin (LOVENOX) 40 MG/0.4ML injection, Inject 40 mg into the skin daily., Disp: , Rfl: ;  FLUoxetine (PROZAC) 20 MG capsule, Take 20 mg by mouth daily., Disp: , Rfl: ;  HYDROcodone-acetaminophen (VICODIN) 5-500 MG per tablet, Take 1 tablet by mouth every 4 (four) hours as needed for pain., Disp: 20 tablet, Rfl: 0 ibuprofen (ADVIL,MOTRIN) 600 MG tablet, Take 1  tablet (600 mg total) by mouth every 6 (six) hours as needed for pain., Disp: 90 tablet, Rfl: 0  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 Never smoker. Social alcohol.   Family History: See history of present illness  Review of  Systems: Negative except as noted in the history of present illness.   Physical Exam: Blood pressure 108/63, pulse 64, temperature 98.5 F (36.9 C), temperature source Oral, height 5\' 4"  (1.626 m), weight 148 lb 6.4 oz (67.314 kg), SpO2 99.00%. Wt Readings from Last 3 Encounters:  12/21/13 148 lb 6.4 oz (67.314 kg)  05/05/12 145 lb (65.772 kg)  05/05/12 145 lb (65.772 kg)     General appearance: Well-nourished Caucasian woman  HENNT: Pharynx no erythema, exudate, mass, or ulcer. No thyromegaly or thyroid nodules Lymph nodes: No cervical, supraclavicular, or axillary lymphadenopathy Breasts: Lungs: Clear to auscultation, resonant to percussion throughout Heart: Regular rhythm, no murmur, no gallop, no rub, no click, no edema Abdomen: Soft, nontender, normal bowel sounds, no mass, no organomegaly Extremities: No edema, no calf tenderness Musculoskeletal: no joint deformities.  left calf 38.5 cm, right 37.5. Left ankle 21 cm, right 21 cm. GU:  Vascular: Carotid pulses 2+, no bruits, distal pulses: Dorsalis pedis 1+ symmetric Neurologic: Alert, oriented, PERRLA, optic discs sharp and vessels normal, no hemorrhage or exudate, cranial nerves grossly normal, motor strength 5 over 5, reflexes 1+ symmetric, upper body coordination normal, gait normal, Skin: No rash;  there are some  ecchymoses on her abdominal wall from Lovenox injections    Lab Results: Lab Results  Component Value Date   WBC 6.9 12/21/2013   HGB 13.0 12/21/2013   HCT 38.1 12/21/2013   MCV 95.3 12/21/2013   PLT 278 12/21/2013     Chemistry      Component Value Date/Time   NA 138 12/21/2013 1625   K 4.3 12/21/2013 1625   CL 100 12/21/2013 1625   CO2 25 12/21/2013 1625   BUN 13 12/21/2013 1625   CREATININE 1.17* 12/21/2013 1625   CREATININE 0.9 01/30/2010 1204      Component Value Date/Time   CALCIUM 9.2 12/21/2013 1625   ALKPHOS 55 12/21/2013 1625   AST 19 12/21/2013 1625   ALT 17 12/21/2013 1625   BILITOT 0.2*  12/21/2013 1625        Impression and Plan: History of recurrent miscarriage presumably related to elevated antiphospholipid antibodies. No personal history of thrombotic events. Now [redacted] weeks pregnant.  She has already started prophylactic dose low molecular weight heparin and low-dose aspirin. I believe this is the optimal regimen for her. I recommended the addition of a calcium and vitamin D supplement to her prenatal vitamins as a precaution although we have not seen significant osteoporosis with low molecular weight heparin which is a significant potential complication of unfractionated heparin. I will continue the low molecular weight heparin up until the time of delivery. Schedule a planned induction of labor with discontinuation of the Lovenox 24 hours prior to delivery. Discontinue aspirin 2 weeks prior to delivery. Resume the low molecular weight heparin 12-24 hours postpartum and continue for 6 weeks.  Although some experts advise changing to unfractionated heparin in the third trimester, there is no literature support to suggest that this is better or safer than continuing low molecular weight heparin. Unfractionated heparin is highly protein bound and PTT testing is unreliable in pregnancy, there is up to a 5% incidence of heparin-induced thrombocytopenia which is not seen with low molecular  weight heparin:  there is a significant risk for osteoporosis even in young people, and, although it can be completely reversed with protamine, protamine doses have not been standardized for reversal of subcutaneous heparin and the protamine itself carries a 1% incidence of anaphylaxis.  I'll see her in a few months as the pregnancy progresses. She is advised to call for any interim problems or concerns.  Marland Kitchen       Annia Belt, MD 12/21/2013, 7:18 PM

## 2014-02-08 ENCOUNTER — Encounter: Payer: Self-pay | Admitting: Diagnostic Neuroimaging

## 2014-02-08 ENCOUNTER — Ambulatory Visit (INDEPENDENT_AMBULATORY_CARE_PROVIDER_SITE_OTHER): Payer: BC Managed Care – PPO | Admitting: Diagnostic Neuroimaging

## 2014-02-08 ENCOUNTER — Encounter (INDEPENDENT_AMBULATORY_CARE_PROVIDER_SITE_OTHER): Payer: Self-pay

## 2014-02-08 VITALS — BP 103/57 | HR 64 | Ht 64.0 in | Wt 151.0 lb

## 2014-02-08 DIAGNOSIS — Z0289 Encounter for other administrative examinations: Secondary | ICD-10-CM

## 2014-02-08 DIAGNOSIS — R29898 Other symptoms and signs involving the musculoskeletal system: Secondary | ICD-10-CM

## 2014-02-08 DIAGNOSIS — M25511 Pain in right shoulder: Secondary | ICD-10-CM

## 2014-02-08 DIAGNOSIS — M6289 Other specified disorders of muscle: Secondary | ICD-10-CM

## 2014-02-08 NOTE — Progress Notes (Addendum)
[facility]  PATIENT: Carol Mckenzie DOB: Apr 21, 1970  REFERRING CLINICIAN: Delman Cheadle / self  HISTORY FROM: patient  REASON FOR VISIT: new consult   HISTORICAL  CHIEF COMPLAINT:  Chief Complaint  Patient presents with  . Tremors    HISTORY OF PRESENT ILLNESS:   43 year old right-handed female here for evaluation of right arm and hand weakness since January 2015. Patient has history of antiphospholipid antibody syndrome and migraine with aura. January 2015 patient noticed gradual onset, progressive weakness in her right hand muscles. Patient was having more difficulty performing at work (patient is a Paediatric nurse locally, performs injections, procedures and surgeries). Patient had to modify her injection technique to accommodate her perceived weakness in the right hand muscles. Patient has also noted right shoulder pain over the last few months. She has been working with a personal care for the past 1 year. She's had to reduce her strength training exercises due to right shoulder pain. Patient has not noted any problems in her left hand, legs, face, vision, speech or swallowing. No breathing problems.  Patient has history of migraine with aura since age 12 years old. Symptoms typically consist of visual aura, scintillating scotoma which spreads across visual field, followed by severe throbbing headache with nausea, vomiting, photophobia and phonophobia. Patient typically has 1-2 days of migraine per month, associated with menstrual cycle. Patient previously tried topiramate but this caused numbness and cognition problems. Patient is now on fluoxetine for depression, which also helps migraine. She uses ibuprofen and Excedrin Migraine for breakthough headaches.  Patient has history of antiphospholipid antibody syndrome diagnosed 2014. Patient had pregnancies in 2001 and 2003, with healthy daughter and son being born. She did not get pregnant until 10 years later and then had  early miscarriages with third, fourth, fifth and sixth pregnancies, all approximately 8-[redacted] weeks gestation. Patient had reproducible elevations of anticardiolipin and antibody to 2 glycoprotein antibodies. Patient had recent miscarriage last month. No prior history of DVT, PE, stroke or heart attack. Patient's mother had clots in her 40s.  Patient has history of B12 deficiency a few years ago (? Less than 100) and was treated with injection replacement. Last checked within the last 1 year, and apparently normalized.  Patient has family history of tremor in her mother and maternal grandmother. Patient has noticed some mild postural and action tremor over past 2-3 years.  Patient has family history of ALS in her grandmother. Patient concerned about similar diagnosis.   REVIEW OF SYSTEMS: Full 14 system review of systems performed and notable only for weakness tremor.   ALLERGIES: Allergies  Allergen Reactions  . Other Hives    Reaction to unknown narcotic post C-section    HOME MEDICATIONS: Outpatient Prescriptions Prior to Visit  Medication Sig Dispense Refill  . FLUoxetine (PROZAC) 20 MG capsule Take 20 mg by mouth daily.    Marland Kitchen ibuprofen (ADVIL,MOTRIN) 600 MG tablet Take 1 tablet (600 mg total) by mouth every 6 (six) hours as needed for pain. 90 tablet 0  . aspirin EC 81 MG tablet Take 81 mg by mouth daily.    Marland Kitchen enoxaparin (LOVENOX) 40 MG/0.4ML injection Inject 40 mg into the skin daily.    Marland Kitchen HYDROcodone-acetaminophen (VICODIN) 5-500 MG per tablet Take 1 tablet by mouth every 4 (four) hours as needed for pain. 20 tablet 0   No facility-administered medications prior to visit.    PAST MEDICAL HISTORY: Past Medical History  Diagnosis Date  . PONV (postoperative nausea and vomiting)   .  Depression   . MVP (mitral valve prolapse)     Does not get pre-treated w/abx - no problem per pt  . 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  Mitral valve prolapse 12/21/2013  . Migraine     PAST SURGICAL HISTORY: Past Surgical History  Procedure Laterality Date  . Cesarean section    . Cholecystectomy    . Wisdom tooth extraction    . Rhinoplasty    . Abdominoplasty    . Dilation and evacuation  05/09/2012    Procedure: DILATATION AND EVACUATION;  Surgeon: Freddrick March. Tenny Craw, MD;  Location: [facility];  Service: Gynecology;  Laterality: N/A;    FAMILY HISTORY: Family History  Problem Relation Age of Onset  . Suicidality Father     SOCIAL HISTORY:  History   Social History  . Marital Status: Married    Spouse Name: Ivin Booty    Number of Children: 2  . Years of Education: MD   Occupational History  .  Other    Dermatology Specialist   Social History Main Topics  . Smoking status: Never Smoker   . Smokeless tobacco: Never Used  . Alcohol Use: Yes     Comment: socially but none with pregnancy  . Drug Use: No  . Sexual Activity: Yes    Birth Control/ Protection: None     Comment: approx [redacted] wks gestation   Other Topics Concern  . Not on file   Social History Narrative   Patient lives at home with family.   Caffeine use: 1 cup daily     PHYSICAL EXAM  Filed Vitals:   02/08/14 0859  BP: 103/57  Pulse: 64  Height: 5\' 4"  (1.626 m)  Weight: 151 lb (68.493 kg)    Not recorded      Visual Acuity Screening   Right eye Left eye Both eyes  Without correction: 20/30 20/30   With correction:        Body mass index is 25.91 kg/(m^2).  GENERAL EXAM: Patient is in no distress; well developed, nourished and groomed; neck is supple  CARDIOVASCULAR: Regular rate and rhythm, no murmurs, no carotid bruits  NEUROLOGIC: MENTAL STATUS: awake, alert, oriented to person, place and time, recent and remote memory intact, normal attention and concentration, language fluent, comprehension intact, naming intact, fund of knowledge appropriate CRANIAL NERVE: no papilledema on fundoscopic exam, pupils equal and reactive to  light, visual fields full to confrontation, extraocular muscles intact, no nystagmus, facial sensation and strength symmetric, hearing intact, palate elevates symmetrically, uvula midline, shoulder shrug symmetric, tongue midline; NO TONGUE FASCICULATIONS. MOTOR: MILD POSTURAL TREMOR IN RUE; normal bulk and tone, full strength in the BUE AND BLE EXCEPT: SUBJECTIVE WEAKNESS IN RIGHT DELTOID WITH MILD PAIN. MILD WEAKNESS (4+/5) IN RIGHT HAND FINGER FLEXION (DIGITS 4,5) AND RIGHT THUMB ABDUCTION. NO ATROPHY. NO FASCICULATIONS. SENSORY: normal and symmetric to light touch, pinprick, temperature, vibration  COORDINATION: finger-nose-finger, fine finger movements normal REFLEXES: deep tendon reflexes present and symmetric; SLIGHTLY BRISK IN KNEES; DOWN GOING TOES GAIT/STATION: narrow based gait; able to walk on toes, heels and tandem; romberg is negative    DIAGNOSTIC DATA (LABS, IMAGING, TESTING) - I reviewed patient records, labs, notes, testing and imaging myself where available.  Lab Results  Component Value Date   WBC 6.9 12/21/2013   HGB 13.0 12/21/2013   HCT 38.1 12/21/2013   MCV 95.3 12/21/2013   PLT 278 12/21/2013      Component Value  Date/Time   NA 138 12/21/2013 1625   K 4.3 12/21/2013 1625   CL 100 12/21/2013 1625   CO2 25 12/21/2013 1625   GLUCOSE 89 12/21/2013 1625   BUN 13 12/21/2013 1625   CREATININE 1.17* 12/21/2013 1625   CREATININE 0.9 01/30/2010 1204   CALCIUM 9.2 12/21/2013 1625   PROT 6.8 12/21/2013 1625   ALBUMIN 3.7 12/21/2013 1625   AST 19 12/21/2013 1625   ALT 17 12/21/2013 1625   ALKPHOS 55 12/21/2013 1625   BILITOT 0.2* 12/21/2013 1625   GFRNONAA >60 05/02/2008 1735   GFRAA  05/02/2008 1735    >60        The eGFR has been calculated using the MDRD equation. This calculation has not been validated in all clinical situations. eGFR's persistently <60 mL/min signify possible Chronic Kidney Disease.   No results found for: CHOL, HDL, LDLCALC,  LDLDIRECT, TRIG, CHOLHDL No results found for: HGBA1C No results found for: VITAMINB12 Lab Results  Component Value Date   TSH 1.94 05/28/2007      ASSESSMENT AND PLAN  43 y.o. year old female here with progressive weakness in right hand since Jan 2015. Exam shows mild weakness in right hand muscles (right APB, right 4th,5th finger flexors). No sensory abnnormalities.   Ddx: peripheral neuropathy (median and ulnar), cervical radiculopathy, motor neuropathy, myopathy, stroke  PLAN: - EMG/NCS later today - then possible MRI cervical spine and brain and labs  Return for for NCV/EMG.    Penni Bombard, MD 46/07/3140, 7:67 AM Certified in Neurology, Neurophysiology and Neuroimaging  [facility] [address] [place], [place] [address] [phone]

## 2014-02-08 NOTE — Patient Instructions (Signed)
I will check EMG/NCS.

## 2014-02-08 NOTE — Procedures (Signed)
   [facility]  NCS (NERVE CONDUCTION STUDY) WITH EMG (ELECTROMYOGRAPHY) REPORT   STUDY DATE: 02/08/14 PATIENT NAME: MACKENZE GRANDISON DOB: December 28, 1970 MRN: [id number]  ORDERING CLINICIAN: Andrey Spearman, MD   TECHNOLOGIST: Laretta Alstrom ELECTROMYOGRAPHER: Earlean Polka. Hadeel Hillebrand, MD  CLINICAL INFORMATION: 43 year old female with right arm/hand weakness and incoordination. No fasciculations noted on exam.   FINDINGS: NERVE CONDUCTION STUDY: Bilateral median and ulnar motor responses and F wave latencies are normal. Bilateral median and ulnar sensory responses are normal.  NEEDLE ELECTROMYOGRAPHY: Needle examination of right upper extremity (deltoid, biceps, triceps, flexor carpi radialis, first dorsal interosseous) and right C7-T1 paraspinal muscles    IMPRESSION:  Normal study. No evidence of large fiber neuropathy, myopathy or right cervical radiculopathy at this time.      INTERPRETING PHYSICIAN:  Penni Bombard, MD Certified in Neurology, Neurophysiology and Neuroimaging  [facility] [address] [place], [place] [address] [phone]

## 2014-03-15 ENCOUNTER — Ambulatory Visit
Admission: RE | Admit: 2014-03-15 | Discharge: 2014-03-15 | Disposition: A | Discharge status: Home / Self Care | Payer: BC Managed Care – PPO | Source: Ambulatory Visit | Attending: Diagnostic Neuroimaging | Admitting: Diagnostic Neuroimaging

## 2014-03-15 ENCOUNTER — Encounter: Payer: Self-pay | Admitting: Radiology

## 2014-03-15 DIAGNOSIS — M25511 Pain in right shoulder: Secondary | ICD-10-CM

## 2014-03-15 DIAGNOSIS — R29898 Other symptoms and signs involving the musculoskeletal system: Secondary | ICD-10-CM

## 2014-03-15 DIAGNOSIS — M6289 Other specified disorders of muscle: Secondary | ICD-10-CM

## 2014-03-16 ENCOUNTER — Telehealth: Payer: Self-pay | Admitting: Diagnostic Neuroimaging

## 2014-03-16 DIAGNOSIS — M5412 Radiculopathy, cervical region: Secondary | ICD-10-CM

## 2014-03-16 DIAGNOSIS — R200 Anesthesia of skin: Secondary | ICD-10-CM

## 2014-03-16 DIAGNOSIS — R29898 Other symptoms and signs involving the musculoskeletal system: Secondary | ICD-10-CM

## 2014-03-16 DIAGNOSIS — R202 Paresthesia of skin: Secondary | ICD-10-CM

## 2014-03-16 MED ORDER — PROPRANOLOL HCL 20 MG PO TABS: 20.0000 mg | ORAL_TABLET | Freq: Two times a day (BID) | ORAL | Status: DC

## 2014-03-16 NOTE — Telephone Encounter (Signed)
I called patient with MRI results. MRI brain normal. MRI cervical spine shows some disc protrusion with potential right C6 impingement. We discussed treatment options and together agreed to pursue conservative mgmt for now. Will try PT/OT. Also, she wants to try propranolol for essential tremor. Will try low dose given her BP tends to run on low side.   Orders Placed This Encounter  Procedures  . Ambulatory referral to Physical Therapy  . Ambulatory referral to Occupational Therapy   Meds ordered this encounter  Medications  . propranolol (INDERAL) 20 MG tablet    Sig: Take 1 tablet (20 mg total) by mouth 2 (two) times daily.    Dispense:  60 tablet    Refill:  [place], MD 42/04/310, 8:11 PM Certified in Neurology, Neurophysiology and Neuroimaging  [facility] [address] [place], [place] [address] [phone]

## 2014-04-09 NOTE — L&D Delivery Note (Signed)
Delivery Note At 11:16 AM a viable and healthy female was delivered via VBAC, Vacuum Assisted (Presentation: Left Occiput Anterior).  APGAR: 8, 9; weight 6 lb 13.9 oz (3116 g).   Placenta status: Intact, Spontaneous.  Cord: 3 vessels with the following complications: None. Loose nuchal cord x 1  After 3 pushes bradycardia developed to the 50s. THe heart rate did not recover despite scalp stimulation and the decision was made to proceed with vacuum extraction. The patient was verbally consented and the kiwi vacuum was applied to the fetal vertex. With one pull and no pop-offs the vertex was delivered to crowning and the vacuum was disengaged. The body was then delivered and the infant was passed to th waiting maternal abdomen.  Anesthesia: Epidural  Episiotomy: None Lacerations: 2nd degree Suture Repair: 3.0 vicryl Est. Blood Loss (mL): 75  Mom to postpartum.  Baby to Couplet care / Skin to Skin.  Tarae Wooden H. 03/02/2015, 3:43 AM

## 2014-05-03 ENCOUNTER — Ambulatory Visit: Payer: BLUE CROSS/BLUE SHIELD | Admitting: Occupational Therapy

## 2014-05-24 ENCOUNTER — Ambulatory Visit: Payer: BC Managed Care – PPO | Admitting: Oncology

## 2014-06-01 ENCOUNTER — Emergency Department (INDEPENDENT_AMBULATORY_CARE_PROVIDER_SITE_OTHER): Payer: BLUE CROSS/BLUE SHIELD

## 2014-06-01 ENCOUNTER — Emergency Department (INDEPENDENT_AMBULATORY_CARE_PROVIDER_SITE_OTHER)
Admission: EM | Admit: 2014-06-01 | Discharge: 2014-06-01 | Disposition: A | Discharge status: Home / Self Care | Payer: BLUE CROSS/BLUE SHIELD | Source: Home / Self Care | Attending: Family Medicine | Admitting: Family Medicine

## 2014-06-01 ENCOUNTER — Encounter (HOSPITAL_COMMUNITY): Payer: Self-pay | Admitting: Emergency Medicine

## 2014-06-01 DIAGNOSIS — J069 Acute upper respiratory infection, unspecified: Secondary | ICD-10-CM

## 2014-06-01 DIAGNOSIS — B9789 Other viral agents as the cause of diseases classified elsewhere: Principal | ICD-10-CM

## 2014-06-01 NOTE — Discharge Instructions (Signed)
Thank you for coming in today. Continue Tylenol or ibuprofen as needed. Call or go to the emergency room if you get worse, have trouble breathing, have chest pains, or palpitations.    Upper Respiratory Infection, Adult An upper respiratory infection (URI) is also sometimes known as the common cold. The upper respiratory tract includes the nose, sinuses, throat, trachea, and bronchi. Bronchi are the airways leading to the lungs. Most people improve within 1 week, but symptoms can last up to 2 weeks. A residual cough may last even longer.  CAUSES Many different viruses can infect the tissues lining the upper respiratory tract. The tissues become irritated and inflamed and often become very moist. Mucus production is also common. A cold is contagious. You can easily spread the virus to others by oral contact. This includes kissing, sharing a glass, coughing, or sneezing. Touching your mouth or nose and then touching a surface, which is then touched by another person, can also spread the virus. SYMPTOMS  Symptoms typically develop 1 to 3 days after you come in contact with a cold virus. Symptoms vary from person to person. They may include:  Runny nose.  Sneezing.  Nasal congestion.  Sinus irritation.  Sore throat.  Loss of voice (laryngitis).  Cough.  Fatigue.  Muscle aches.  Loss of appetite.  Headache.  Low-grade fever. DIAGNOSIS  You might diagnose your own cold based on familiar symptoms, since most people get a cold 2 to 3 times a year. Your caregiver can confirm this based on your exam. Most importantly, your caregiver can check that your symptoms are not due to another disease such as strep throat, sinusitis, pneumonia, asthma, or epiglottitis. Blood tests, throat tests, and X-rays are not necessary to diagnose a common cold, but they may sometimes be helpful in excluding other more serious diseases. Your caregiver will decide if any further tests are required. RISKS AND  COMPLICATIONS  You may be at risk for a more severe case of the common cold if you smoke cigarettes, have chronic heart disease (such as heart failure) or lung disease (such as asthma), or if you have a weakened immune system. The very young and very old are also at risk for more serious infections. Bacterial sinusitis, middle ear infections, and bacterial pneumonia can complicate the common cold. The common cold can worsen asthma and chronic obstructive pulmonary disease (COPD). Sometimes, these complications can require emergency medical care and may be life-threatening. PREVENTION  The best way to protect against getting a cold is to practice good hygiene. Avoid oral or hand contact with people with cold symptoms. Wash your hands often if contact occurs. There is no clear evidence that vitamin C, vitamin E, echinacea, or exercise reduces the chance of developing a cold. However, it is always recommended to get plenty of rest and practice good nutrition. TREATMENT  Treatment is directed at relieving symptoms. There is no cure. Antibiotics are not effective, because the infection is caused by a virus, not by bacteria. Treatment may include:  Increased fluid intake. Sports drinks offer valuable electrolytes, sugars, and fluids.  Breathing heated mist or steam (vaporizer or shower).  Eating chicken soup or other clear broths, and maintaining good nutrition.  Getting plenty of rest.  Using gargles or lozenges for comfort.  Controlling fevers with ibuprofen or acetaminophen as directed by your caregiver.  Increasing usage of your inhaler if you have asthma. Zinc gel and zinc lozenges, taken in the first 24 hours of the common cold, can shorten  the duration and lessen the severity of symptoms. Pain medicines may help with fever, muscle aches, and throat pain. A variety of non-prescription medicines are available to treat congestion and runny nose. Your caregiver can make recommendations and may  suggest nasal or lung inhalers for other symptoms.  HOME CARE INSTRUCTIONS   Only take over-the-counter or prescription medicines for pain, discomfort, or fever as directed by your caregiver.  Use a warm mist humidifier or inhale steam from a shower to increase air moisture. This may keep secretions moist and make it easier to breathe.  Drink enough water and fluids to keep your urine clear or pale yellow.  Rest as needed.  Return to work when your temperature has returned to normal or as your caregiver advises. You may need to stay home longer to avoid infecting others. You can also use a face mask and careful hand washing to prevent spread of the virus. SEEK MEDICAL CARE IF:   After the first few days, you feel you are getting worse rather than better.  You need your caregiver's advice about medicines to control symptoms.  You develop chills, worsening shortness of breath, or brown or red sputum. These may be signs of pneumonia.  You develop yellow or brown nasal discharge or pain in the face, especially when you bend forward. These may be signs of sinusitis.  You develop a fever, swollen neck glands, pain with swallowing, or white areas in the back of your throat. These may be signs of strep throat. SEEK IMMEDIATE MEDICAL CARE IF:   You have a fever.  You develop severe or persistent headache, ear pain, sinus pain, or chest pain.  You develop wheezing, a prolonged cough, cough up blood, or have a change in your usual mucus (if you have chronic lung disease).  You develop sore muscles or a stiff neck. Document Released: 09/19/2000 Document Revised: 06/18/2011 Document Reviewed: 07/01/2013 [facility] Patient Information 2015 [place], [place]. This information is not intended to replace advice given to you by your health care provider. Make sure you discuss any questions you have with your health care provider.

## 2014-06-01 NOTE — ED Provider Notes (Signed)
Carol Mckenzie is a 44 y.o. female who presents to Urgent Care today for fever cough body aches and congestion. Symptoms present for 2 days. No vomiting diarrhea or wheezing. Patient received a flu vaccine this year. She is concerned that she may have pneumonia.   Past Medical History  Diagnosis Date  . PONV (postoperative nausea and vomiting)   . Depression   . MVP (mitral valve prolapse)     Does not get pre-treated w/abx - no problem per pt  . 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 Mitral valve prolapse 12/21/2013  . Migraine    Past Surgical History  Procedure Laterality Date  . Cesarean section    . Cholecystectomy    . Wisdom tooth extraction    . Rhinoplasty    . Abdominoplasty    . Dilation and evacuation  05/09/2012    Procedure: DILATATION AND EVACUATION;  Surgeon: Farrel Gobble. Harrington Challenger, MD;  Location: [facility];  Service: Gynecology;  Laterality: N/A;   History  Substance Use Topics  . Smoking status: Never Smoker   . Smokeless tobacco: Never Used  . Alcohol Use: Yes     Comment: socially but none with pregnancy   ROS as above Medications: No current facility-administered medications for this encounter.   Current Outpatient Prescriptions  Medication Sig Dispense Refill  . venlafaxine (EFFEXOR) 75 MG tablet Take 75 mg by mouth 2 (two) times daily.    Marland Kitchen aspirin EC 81 MG tablet Take 81 mg by mouth daily.    Marland Kitchen FLUoxetine (PROZAC) 20 MG capsule Take 20 mg by mouth daily.    Marland Kitchen ibuprofen (ADVIL,MOTRIN) 600 MG tablet Take 1 tablet (600 mg total) by mouth every 6 (six) hours as needed for pain. 90 tablet 0  . propranolol (INDERAL) 20 MG tablet Take 1 tablet (20 mg total) by mouth 2 (two) times daily. 60 tablet 3   Allergies  Allergen Reactions  . Other Hives    Reaction to unknown narcotic post C-section     Exam:  BP 99/72 mmHg  Pulse 101  Temp(Src) 99.1 F (37.3 C) (Oral)  Resp 12  SpO2 99%  LMP 06/01/2014 Gen:  Well NAD HEENT: EOMI,  MMM posterior pharynx with cobblestoning normal tympanic membranes bilaterally Lungs: Normal work of breathing. Coarse breath sounds right side normal left Heart: RRR no MRG Abd: NABS, Soft. Nondistended, Nontender Exts: Brisk capillary refill, warm and well perfused.   No results found for this or any previous visit (from the past 24 hour(s)). Dg Chest 2 View  06/01/2014   CLINICAL DATA:  Initial encounter for cough. Fever. Body aches for 2 days.  EXAM: CHEST  2 VIEW  COMPARISON:  05/02/2008  FINDINGS: Midline trachea.  Normal heart size and mediastinal contours.  Sharp costophrenic angles. No pneumothorax. Left upper lobe calcified granuloma.  Cholecystectomy clips.  Mild S-shaped thoracic spine curvature.  IMPRESSION: No active cardiopulmonary disease.   Electronically Signed   By: Abigail Miyamoto M.D.   On: 06/01/2014 13:38    Assessment and Plan: 44 y.o. female with viral URI with cough. This radiology findings with patient and radiologist. This change needs no further follow-up. Plan to treat with over-the-counter medications. Follow-up with PCP as needed.  Discussed warning signs or symptoms. Please see discharge instructions. Patient expresses understanding.     Gregor Hams, MD 06/01/14 1352

## 2014-06-01 NOTE — ED Notes (Signed)
Onset Sunday 2/21 of fever, cough, headache, generalized aching

## 2014-08-09 LAB — OB RESULTS CONSOLE HEPATITIS B SURFACE ANTIGEN: HEP B S AG: NEGATIVE

## 2014-08-09 LAB — OB RESULTS CONSOLE HIV ANTIBODY (ROUTINE TESTING): HIV: NONREACTIVE

## 2014-08-09 LAB — OB RESULTS CONSOLE ABO/RH: RH TYPE: POSITIVE

## 2014-08-09 LAB — OB RESULTS CONSOLE RUBELLA ANTIBODY, IGM: RUBELLA: IMMUNE

## 2014-11-12 ENCOUNTER — Other Ambulatory Visit: Payer: Self-pay | Admitting: Obstetrics and Gynecology

## 2014-12-06 ENCOUNTER — Encounter: Payer: Self-pay | Admitting: Oncology

## 2014-12-06 ENCOUNTER — Ambulatory Visit (INDEPENDENT_AMBULATORY_CARE_PROVIDER_SITE_OTHER): Payer: BLUE CROSS/BLUE SHIELD | Admitting: Oncology

## 2014-12-06 VITALS — BP 85/73 | HR 75 | Temp 98.1°F | Ht 64.0 in | Wt 160.6 lb

## 2014-12-06 DIAGNOSIS — O99113 Other diseases of the blood and blood-forming organs and certain disorders involving the immune mechanism complicating pregnancy, third trimester: Secondary | ICD-10-CM

## 2014-12-06 DIAGNOSIS — Z3A00 Weeks of gestation of pregnancy not specified: Secondary | ICD-10-CM | POA: Diagnosis not present

## 2014-12-06 DIAGNOSIS — D6861 Antiphospholipid syndrome: Secondary | ICD-10-CM | POA: Diagnosis not present

## 2014-12-06 DIAGNOSIS — O0001 Abdominal pregnancy with intrauterine pregnancy: Secondary | ICD-10-CM

## 2014-12-06 LAB — OB RESULTS CONSOLE RPR: RPR: REACTIVE

## 2014-12-06 NOTE — Patient Instructions (Signed)
Return visit in December or January  No lab

## 2014-12-06 NOTE — Progress Notes (Signed)
Patient ID: Carol Mckenzie, female   DOB: [birth year], 44 y.o.   MRN: [id number] Hematology and Oncology Follow Up Visit  Carol Mckenzie [id number] Oct 31, 1970 44 y.o. 12/06/2014 3:45 PM   Principle Diagnosis: Encounter Diagnoses  Name Primary?  Marland Kitchen Antiphospholipid antibody syndrome Yes  . 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 Further evaluation of recurrent miscarriages which occurred with her third, fourth, fifth, and 6 pregnancies revealed elevated IgM anticardiolipin and anti-beta 2 glycoprotein 1 antibodies with a reproducibly negative test for the presence of a lupus-type anticoagulant. She was started on prophylactic dose low molecular weight heparin and low-dose aspirin at the time of the current pregnancy. Pregnancy is progressing uneventfully. Her due date is 03/07/2015. She has never had any thrombotic events. She denies any dyspnea, no leg pain or swelling, no abnormal bleeding.  Medications: reviewed  Allergies:  Allergies  Allergen Reactions  . Other Hives    Reaction to unknown narcotic post C-section    Review of Systems: See HPI Remaining ROS negative:   Physical Exam: Blood pressure 85/73, pulse 75, temperature 98.1 F (36.7 C), temperature source Oral, height 5\' 4"  (1.626 m), weight 160 lb 9.6 oz (72.848 kg), last menstrual period 06/01/2014, SpO2 99 %, unknown if currently breastfeeding. Wt Readings from Last 3 Encounters:  12/06/14 160 lb 9.6 oz (72.848 kg)  03/15/14 145 lb (65.772 kg)  02/08/14 151 lb (68.493 kg)     General appearance: well nourished Caucasian woman HENNT: Pharynx no erythema, exudate, mass, or ulcer. No thyromegaly or thyroid nodules Lymph nodes: No cervical, supraclavicular, or axillary lymphadenopathy Breasts:  Lungs: Clear to auscultation, resonant to percussion throughout Heart:  Regular rhythm, no murmur, no gallop, no rub, no click, no edema Abdomen: 3rd trimester pregnancy Extremities: No edema, no calf tenderness Musculoskeletal: no joint deformities GU:  Vascular:  Neurologic: Alert, oriented, PERRLA, optic discs sharp and vessels normal, no hemorrhage or exudate, cranial nerves grossly normal, motor strength 5 over 5, reflexes 1+ symmetric, upper body coordination normal, gait normal, Skin: No rash or ecchymosis  Lab Results: CBC W/Diff    Component Value Date/Time   WBC 6.9 12/21/2013 1625   RBC 4.00 12/21/2013 1625   HGB 13.0 12/21/2013 1625   HCT 38.1 12/21/2013 1625   PLT 278 12/21/2013 1625   MCV 95.3 12/21/2013 1625   MCH 32.5 12/21/2013 1625   MCHC 34.1 12/21/2013 1625   RDW 12.6 12/21/2013 1625   LYMPHSABS 2.0 12/21/2013 1625   MONOABS 0.8 12/21/2013 1625   EOSABS 0.1 12/21/2013 1625   BASOSABS 0.0 12/21/2013 1625     Chemistry      Component Value Date/Time   NA 138 12/21/2013 1625   K 4.3 12/21/2013 1625   CL 100 12/21/2013 1625   CO2 25 12/21/2013 1625   BUN 13 12/21/2013 1625   CREATININE 1.17* 12/21/2013 1625   CREATININE 0.9 01/30/2010 1204      Component Value Date/Time   CALCIUM 9.2 12/21/2013 1625   ALKPHOS 55 12/21/2013 1625   AST 19 12/21/2013 1625   ALT 17 12/21/2013 1625   BILITOT 0.2* 12/21/2013 1625       Radiological Studies: No results found.  Impression:  Anti-phospholipid antibody syndrome characterized exclusively by recurrent early trimester miscarriages. Currently in the third trimester of her seventh pregnancy which is proceeding smoothly.  We again reviewed the  peripartum plan: Continue prophylactic dose (40 mg) low molecular weight heparin. Stop 24 hours prior to a planned induction of labor and resume 12 hours after a vaginal delivery, 24 hours if C-section is required. Stop aspirin 2 weeks prior to her due date. Since anticoagulation was taken primarily for recurrent miscarriage, I don't think  that she needs to resume the aspirin postpartum but I would take the precaution of continuing the prophylactic dose low molecular weight heparin for 6 weeks.    CC: Dr. Vanessa Kick   Annia Belt, MD 8/29/20163:45 PM

## 2014-12-21 ENCOUNTER — Other Ambulatory Visit: Payer: Self-pay | Admitting: Diagnostic Neuroimaging

## 2015-02-04 ENCOUNTER — Other Ambulatory Visit: Payer: Self-pay | Admitting: Obstetrics and Gynecology

## 2015-02-04 LAB — OB RESULTS CONSOLE GBS: GBS: NEGATIVE

## 2015-02-28 ENCOUNTER — Other Ambulatory Visit (HOSPITAL_COMMUNITY): Payer: Self-pay | Admitting: Obstetrics and Gynecology

## 2015-03-01 ENCOUNTER — Encounter (HOSPITAL_COMMUNITY): Payer: Self-pay

## 2015-03-01 ENCOUNTER — Inpatient Hospital Stay (HOSPITAL_COMMUNITY)
Admission: AD | Admit: 2015-03-01 | Discharge: 2015-03-02 | DRG: 774 | Disposition: A | Discharge status: Home / Self Care | Payer: BLUE CROSS/BLUE SHIELD | Source: Ambulatory Visit | Attending: Obstetrics and Gynecology | Admitting: Obstetrics and Gynecology

## 2015-03-01 ENCOUNTER — Inpatient Hospital Stay (HOSPITAL_COMMUNITY): Payer: BLUE CROSS/BLUE SHIELD | Admitting: Anesthesiology

## 2015-03-01 DIAGNOSIS — O9942 Diseases of the circulatory system complicating childbirth: Secondary | ICD-10-CM | POA: Diagnosis present

## 2015-03-01 DIAGNOSIS — D6861 Antiphospholipid syndrome: Secondary | ICD-10-CM | POA: Diagnosis present

## 2015-03-01 DIAGNOSIS — O99119 Other diseases of the blood and blood-forming organs and certain disorders involving the immune mechanism complicating pregnancy, unspecified trimester: Secondary | ICD-10-CM

## 2015-03-01 DIAGNOSIS — O9912 Other diseases of the blood and blood-forming organs and certain disorders involving the immune mechanism complicating childbirth: Secondary | ICD-10-CM | POA: Diagnosis present

## 2015-03-01 DIAGNOSIS — I341 Nonrheumatic mitral (valve) prolapse: Secondary | ICD-10-CM | POA: Diagnosis present

## 2015-03-01 DIAGNOSIS — O09523 Supervision of elderly multigravida, third trimester: Secondary | ICD-10-CM | POA: Diagnosis not present

## 2015-03-01 DIAGNOSIS — O34219 Maternal care for unspecified type scar from previous cesarean delivery: Secondary | ICD-10-CM | POA: Diagnosis present

## 2015-03-01 DIAGNOSIS — Z3A39 39 weeks gestation of pregnancy: Secondary | ICD-10-CM | POA: Diagnosis not present

## 2015-03-01 DIAGNOSIS — O2623 Pregnancy care for patient with recurrent pregnancy loss, third trimester: Secondary | ICD-10-CM | POA: Diagnosis present

## 2015-03-01 LAB — COMPREHENSIVE METABOLIC PANEL
ALBUMIN: 2.7 g/dL — AB (ref 3.5–5.0)
ALK PHOS: 132 U/L — AB (ref 38–126)
ALT: 12 U/L — AB (ref 14–54)
ANION GAP: 8 (ref 5–15)
AST: 20 U/L (ref 15–41)
BILIRUBIN TOTAL: 0.5 mg/dL (ref 0.3–1.2)
BUN: 8 mg/dL (ref 6–20)
CALCIUM: 8.5 mg/dL — AB (ref 8.9–10.3)
CO2: 20 mmol/L — AB (ref 22–32)
CREATININE: 0.52 mg/dL (ref 0.44–1.00)
Chloride: 106 mmol/L (ref 101–111)
GFR calc Af Amer: >60 mL/min (ref >60)
GFR calc non Af Amer: >60 mL/min (ref >60)
GLUCOSE: 85 mg/dL (ref 65–99)
Potassium: 4.3 mmol/L (ref 3.5–5.1)
Sodium: 134 mmol/L — ABNORMAL LOW (ref 135–145)
TOTAL PROTEIN: 5.5 g/dL — AB (ref 6.5–8.1)

## 2015-03-01 LAB — CBC
HEMATOCRIT: 30.7 % — AB (ref 36.0–46.0)
HEMOGLOBIN: 10.3 g/dL — AB (ref 12.0–15.0)
MCH: 31.1 pg (ref 26.0–34.0)
MCHC: 33.6 g/dL (ref 30.0–36.0)
MCV: 92.7 fL (ref 78.0–100.0)
Platelets: 189 10*3/uL (ref 150–400)
RBC: 3.31 MIL/uL — AB (ref 3.87–5.11)
RDW: 13.9 % (ref 11.5–15.5)
WBC: 8.6 10*3/uL (ref 4.0–10.5)

## 2015-03-01 LAB — TYPE AND SCREEN
ABO/RH(D): O POS
Antibody Screen: NEGATIVE

## 2015-03-01 LAB — ABO/RH: ABO/RH(D): O POS

## 2015-03-01 MED ORDER — LACTATED RINGERS IV SOLN: 500.0000 mL | INTRAVENOUS | Status: DC | PRN

## 2015-03-01 MED ORDER — METHYLERGONOVINE MALEATE 0.2 MG/ML IJ SOLN: 0.2000 mg | INTRAMUSCULAR | Status: DC | PRN

## 2015-03-01 MED ORDER — DIBUCAINE 1 % RE OINT: 1.0000 "application " | TOPICAL_OINTMENT | RECTAL | Status: DC | PRN

## 2015-03-01 MED ORDER — ZOLPIDEM TARTRATE 5 MG PO TABS: 5.0000 mg | ORAL_TABLET | Freq: Every evening | ORAL | Status: DC | PRN

## 2015-03-01 MED ORDER — ACETAMINOPHEN 325 MG PO TABS: 650.0000 mg | ORAL_TABLET | ORAL | Status: DC | PRN

## 2015-03-01 MED ORDER — BUTORPHANOL TARTRATE 1 MG/ML IJ SOLN: 1.0000 mg | INTRAMUSCULAR | Status: DC | PRN

## 2015-03-01 MED ORDER — LIDOCAINE HCL (PF) 1 % IJ SOLN
30.0000 mL | INTRAMUSCULAR | Status: DC | PRN
  Filled 2015-03-01: qty 30

## 2015-03-01 MED ORDER — OXYTOCIN 40 UNITS IN LACTATED RINGERS INFUSION - SIMPLE MED: 62.5000 mL/h | INTRAVENOUS | Status: DC

## 2015-03-01 MED ORDER — SIMETHICONE 80 MG PO CHEW: 80.0000 mg | CHEWABLE_TABLET | ORAL | Status: DC | PRN

## 2015-03-01 MED ORDER — ONDANSETRON HCL 4 MG/2ML IJ SOLN: 4.0000 mg | Freq: Four times a day (QID) | INTRAMUSCULAR | Status: DC | PRN

## 2015-03-01 MED ORDER — SENNOSIDES-DOCUSATE SODIUM 8.6-50 MG PO TABS
2.0000 | ORAL_TABLET | ORAL | Status: DC
  Administered 2015-03-02: 2 via ORAL
  Filled 2015-03-01: qty 2

## 2015-03-01 MED ORDER — PRENATAL MULTIVITAMIN CH: 1.0000 | ORAL_TABLET | Freq: Every day | ORAL | Status: DC

## 2015-03-01 MED ORDER — FENTANYL 2.5 MCG/ML BUPIVACAINE 1/10 % EPIDURAL INFUSION (WH - ANES)
14.0000 mL/h | INTRAMUSCULAR | Status: DC | PRN
  Administered 2015-03-01: 14 mL/h via EPIDURAL
  Filled 2015-03-01: qty 125

## 2015-03-01 MED ORDER — TETANUS-DIPHTH-ACELL PERTUSSIS 5-2.5-18.5 LF-MCG/0.5 IM SUSP: 0.5000 mL | Freq: Once | INTRAMUSCULAR | Status: DC

## 2015-03-01 MED ORDER — LANOLIN HYDROUS EX OINT: TOPICAL_OINTMENT | CUTANEOUS | Status: DC | PRN

## 2015-03-01 MED ORDER — TERBUTALINE SULFATE 1 MG/ML IJ SOLN
0.2500 mg | Freq: Once | INTRAMUSCULAR | Status: DC | PRN
  Filled 2015-03-01: qty 1

## 2015-03-01 MED ORDER — OXYCODONE-ACETAMINOPHEN 5-325 MG PO TABS: 1.0000 | ORAL_TABLET | ORAL | Status: DC | PRN

## 2015-03-01 MED ORDER — ONDANSETRON HCL 4 MG/2ML IJ SOLN: 4.0000 mg | INTRAMUSCULAR | Status: DC | PRN

## 2015-03-01 MED ORDER — DIPHENHYDRAMINE HCL 25 MG PO CAPS: 25.0000 mg | ORAL_CAPSULE | Freq: Four times a day (QID) | ORAL | Status: DC | PRN

## 2015-03-01 MED ORDER — OXYTOCIN BOLUS FROM INFUSION: 500.0000 mL | INTRAVENOUS | Status: DC

## 2015-03-01 MED ORDER — WITCH HAZEL-GLYCERIN EX PADS: 1.0000 "application " | MEDICATED_PAD | CUTANEOUS | Status: DC | PRN

## 2015-03-01 MED ORDER — OXYCODONE-ACETAMINOPHEN 5-325 MG PO TABS: 2.0000 | ORAL_TABLET | ORAL | Status: DC | PRN

## 2015-03-01 MED ORDER — EPHEDRINE 5 MG/ML INJ
10.0000 mg | INTRAVENOUS | Status: DC | PRN
  Filled 2015-03-01: qty 2

## 2015-03-01 MED ORDER — IBUPROFEN 600 MG PO TABS
600.0000 mg | ORAL_TABLET | Freq: Four times a day (QID) | ORAL | Status: DC
  Administered 2015-03-01 – 2015-03-02 (×3): 600 mg via ORAL
  Filled 2015-03-01 (×3): qty 1

## 2015-03-01 MED ORDER — BENZOCAINE-MENTHOL 20-0.5 % EX AERO
1.0000 "application " | INHALATION_SPRAY | CUTANEOUS | Status: DC | PRN
  Administered 2015-03-02: 1 via TOPICAL
  Filled 2015-03-01 (×2): qty 56

## 2015-03-01 MED ORDER — DIPHENHYDRAMINE HCL 50 MG/ML IJ SOLN: 12.5000 mg | INTRAMUSCULAR | Status: DC | PRN

## 2015-03-01 MED ORDER — PHENYLEPHRINE 40 MCG/ML (10ML) SYRINGE FOR IV PUSH (FOR BLOOD PRESSURE SUPPORT)
80.0000 ug | PREFILLED_SYRINGE | INTRAVENOUS | Status: DC | PRN
  Filled 2015-03-01: qty 20
  Filled 2015-03-01: qty 2

## 2015-03-01 MED ORDER — LIDOCAINE HCL (PF) 1 % IJ SOLN
INTRAMUSCULAR | Status: DC | PRN
  Administered 2015-03-01: 4 mL

## 2015-03-01 MED ORDER — OXYTOCIN 40 UNITS IN LACTATED RINGERS INFUSION - SIMPLE MED
1.0000 m[IU]/min | INTRAVENOUS | Status: DC
  Administered 2015-03-01: 1 m[IU]/min via INTRAVENOUS
  Filled 2015-03-01: qty 1000

## 2015-03-01 MED ORDER — LACTATED RINGERS IV SOLN
INTRAVENOUS | Status: DC
  Administered 2015-03-01: 125 mL/h via INTRAVENOUS

## 2015-03-01 MED ORDER — ENOXAPARIN SODIUM 40 MG/0.4ML ~~LOC~~ SOLN
40.0000 mg | SUBCUTANEOUS | Status: DC
  Administered 2015-03-01: 40 mg via SUBCUTANEOUS
  Filled 2015-03-01: qty 0.4

## 2015-03-01 MED ORDER — CITRIC ACID-SODIUM CITRATE 334-500 MG/5ML PO SOLN: 30.0000 mL | ORAL | Status: DC | PRN

## 2015-03-01 MED ORDER — METHYLERGONOVINE MALEATE 0.2 MG PO TABS: 0.2000 mg | ORAL_TABLET | ORAL | Status: DC | PRN

## 2015-03-01 MED ORDER — ONDANSETRON HCL 4 MG PO TABS: 4.0000 mg | ORAL_TABLET | ORAL | Status: DC | PRN

## 2015-03-01 MED ORDER — FLUOXETINE HCL 20 MG PO CAPS
20.0000 mg | ORAL_CAPSULE | Freq: Every day | ORAL | Status: DC
  Administered 2015-03-01 – 2015-03-02 (×2): 20 mg via ORAL
  Filled 2015-03-01 (×2): qty 1

## 2015-03-01 NOTE — Progress Notes (Signed)
MOB was referred for history of depression/anxiety.  Referral is screened out by Clinical Social Worker because none of the following criteria appear to apply: -History of anxiety/depression during this pregnancy, or of post-partum depression. - Diagnosis of anxiety and/or depression within last 3 years - History of depression due to pregnancy loss/loss of child or -MOB's symptoms are currently being treated with medication and/or therapy. MOB is currently prescribed Prozac.  Please contact the Clinical Social Worker if needs arise or upon MOB request.

## 2015-03-01 NOTE — Lactation Note (Signed)
This note was copied from the chart of Carol Mckenzie. Lactation Consultation Note Initial visit  7 hours of age. Mom reports a few feedings and baby has been sleepy.  Mom reports having some pain with latch.  Discussed breastfeeding basics.  Mom has visitors and Dr come to room during visit so it was brief.  Encouraged mom to request RN to see latch this evening.  [facility] LC resources given and discussed.  Encouraged to feed with early cues on demand.  Early newborn behavior discussed.  Hand expression not demonstrated to mom yet, encouraged mom to request assist from RN when she is ready to learn HE.  Mom to call for assist as needed.    Patient Name: Carol Maisy Spraggins [id number] Date: 03/01/2015 Reason for consult: Initial assessment   Maternal Data Has patient been taught Hand Expression?: Yes Does the patient have breastfeeding experience prior to this delivery?: Yes  Feeding    LATCH Score/Interventions                      Lactation Tools Discussed/Used     Consult Status Consult Status: Follow-up Date: 03/02/15 Follow-up type: In-patient    Justice Britain 03/01/2015, 8:08 PM

## 2015-03-01 NOTE — Anesthesia Preprocedure Evaluation (Addendum)
Anesthesia Evaluation  Patient identified by MRN, date of birth, ID band Patient awake    Reviewed: Allergy & Precautions, NPO status , Patient's Chart, lab work & pertinent test results  History of Anesthesia Complications (+) PONV and history of anesthetic complications  Airway Mallampati: II  TM Distance: >3 FB Neck ROM: Full    Dental no notable dental hx. (+) Dental Advisory Given   Pulmonary neg pulmonary ROS,    Pulmonary exam normal breath sounds clear to auscultation       Cardiovascular negative cardio ROS Normal cardiovascular exam+ Valvular Problems/Murmurs MVP  Rhythm:Regular Rate:Normal     Neuro/Psych  Headaches, negative psych ROS   GI/Hepatic negative GI ROS, Neg liver ROS,   Endo/Other  negative endocrine ROS  Renal/GU negative Renal ROS  negative genitourinary   Musculoskeletal negative musculoskeletal ROS (+)   Abdominal   Peds negative pediatric ROS (+)  Hematology Antiphospholipid antibody syndrome, on lovenox 40mg  once daily, last dose was on 11/20 which is greater than 24 hours prior to epidural placement   Anesthesia Other Findings   Reproductive/Obstetrics (+) Pregnancy                            Anesthesia Physical Anesthesia Plan  ASA: II  Anesthesia Plan: Epidural   Post-op Pain Management:    Induction:   Airway Management Planned:   Additional Equipment:   Intra-op Plan:   Post-operative Plan:   Informed Consent: I have reviewed the patients History and Physical, chart, labs and discussed the procedure including the risks, benefits and alternatives for the proposed anesthesia with the patient or authorized representative who has indicated his/her understanding and acceptance.   Dental advisory given  Plan Discussed with:   Anesthesia Plan Comments:         Anesthesia Quick Evaluation

## 2015-03-01 NOTE — Anesthesia Postprocedure Evaluation (Signed)
Anesthesia Post Note  Patient: Carol Mckenzie  Procedure(s) Performed: * No procedures listed *  Patient location during evaluation: Mother Baby Anesthesia Type: Epidural Level of consciousness: awake and alert and oriented Pain management: satisfactory to patient Vital Signs Assessment: post-procedure vital signs reviewed and stable Respiratory status: respiratory function stable and spontaneous breathing Cardiovascular status: stable Postop Assessment: No headache, No backache, Patient able to bend at knees, No signs of nausea or vomiting and Adequate PO intake Anesthetic complications: no    Last Vitals:  Filed Vitals:   03/01/15 1301 03/01/15 1400  BP: 105/92 104/58  Pulse: 62 74  Temp:  36.8 C  Resp:  16    Last Pain:  Filed Vitals:   03/01/15 1451  PainSc: 0-No pain                 Cove Haydon

## 2015-03-01 NOTE — Anesthesia Procedure Notes (Signed)
Epidural Patient location during procedure: OB  Staffing Anesthesiologist: Claude Swendsen Performed by: anesthesiologist   Preanesthetic Checklist Completed: patient identified, site marked, surgical consent, pre-op evaluation, timeout performed, IV checked, risks and benefits discussed and monitors and equipment checked  Epidural Patient position: sitting Prep: site prepped and draped and DuraPrep Patient monitoring: continuous pulse ox and blood pressure Approach: midline Location: L3-L4 Injection technique: LOR saline  Needle:  Needle type: Tuohy  Needle gauge: 17 G Needle length: 9 cm and 9 Needle insertion depth: 5 cm cm Catheter type: closed end flexible Catheter size: 19 Gauge Catheter at skin depth: 11 cm Test dose: negative  Assessment Events: blood not aspirated, injection not painful, no injection resistance, negative IV test and no paresthesia  Additional Notes Patient identified. Risks/Benefits/Options discussed with patient including but not limited to bleeding, infection, nerve damage, paralysis, failed block, incomplete pain control, headache, blood pressure changes, nausea, vomiting, reactions to medication both or allergic, itching and postpartum back pain. Confirmed with bedside nurse the patient's most recent platelet count. Confirmed with patient that they are not currently taking any anticoagulation, have any bleeding history or any family history of bleeding disorders. Patient expressed understanding and wished to proceed. All questions were answered. Sterile technique was used throughout the entire procedure. Please see nursing notes for vital signs. Test dose was given through epidural catheter and negative prior to continuing to dose epidural or start infusion. Warning signs of high block given to the patient including shortness of breath, tingling/numbness in hands, complete motor block, or any concerning symptoms with instructions to call for help. Patient was  given instructions on fall risk and not to get out of bed. All questions and concerns addressed with instructions to call with any issues or inadequate analgesia.

## 2015-03-02 LAB — CBC
HCT: 30.6 % — ABNORMAL LOW (ref 36.0–46.0)
Hemoglobin: 10.2 g/dL — ABNORMAL LOW (ref 12.0–15.0)
MCH: 31.3 pg (ref 26.0–34.0)
MCHC: 33.3 g/dL (ref 30.0–36.0)
MCV: 93.9 fL (ref 78.0–100.0)
PLATELETS: 159 10*3/uL (ref 150–400)
RBC: 3.26 MIL/uL — ABNORMAL LOW (ref 3.87–5.11)
RDW: 14.1 % (ref 11.5–15.5)
WBC: 10.4 10*3/uL (ref 4.0–10.5)

## 2015-03-02 LAB — RPR: RPR: REACTIVE — AB

## 2015-03-02 LAB — RPR, QUANT+TP ABS (REFLEX): T Pallidum Abs: NEGATIVE

## 2015-03-02 MED ORDER — DOCUSATE SODIUM 100 MG PO CAPS: 100.0000 mg | ORAL_CAPSULE | Freq: Two times a day (BID) | ORAL | Status: DC

## 2015-03-02 MED ORDER — OXYCODONE-ACETAMINOPHEN 5-325 MG PO TABS: 1.0000 | ORAL_TABLET | ORAL | Status: DC | PRN

## 2015-03-02 MED ORDER — IBUPROFEN 600 MG PO TABS: 600.0000 mg | ORAL_TABLET | Freq: Four times a day (QID) | ORAL | Status: DC | PRN

## 2015-03-09 NOTE — Discharge Summary (Signed)
Obstetric Discharge Summary Reason for Admission: induction of labor Prenatal Procedures: NST and ultrasound Intrapartum Procedures: vacuum Postpartum Procedures: none Complications-Operative and Postpartum: 2nd degree perineal laceration HEMOGLOBIN  Date Value Ref Range Status  03/02/2015 10.2* 12.0 - 15.0 g/dL Final   HCT  Date Value Ref Range Status  03/02/2015 30.6* 36.0 - 46.0 % Final    Physical Exam:  General: alert, cooperative and appears stated age 18: appropriate Uterine Fundus: firm DVT Evaluation: No evidence of DVT seen on physical exam.  Discharge Diagnoses: Term Pregnancy-delivered  Discharge Information: Date: 03/09/2015 Activity: pelvic rest Diet: routine Medications: PNV, Colace, Percocet and Lovenox Condition: improved Instructions: refer to practice specific booklet Discharge to: home   Newborn Data: Live born female  Birth Weight: 6 lb 13.9 oz (3116 g) APGAR: 8, 9  Home with mother.  Kiwana Deblasi H. 03/09/2015, 10:52 AM

## 2015-03-09 NOTE — H&P (Signed)
Carol Mckenzie is a 44 y.o. female presenting for IOL for antiphospholipid antibody syndrome  44 yo Carol Mckenzie @ 39+0 presents for IOL for Antiphospholipid antibody syndrome. She has a h/o recurrent pregnancy loss. W/U revealed antiphospholipid antibody syndrome. She has been on ASA 81 mg and Lovenox 40 SQ during this pregnancy. She has a h/o c/s for breech followed by a successful VBAC History OB History    Gravida Para Term Preterm AB TAB SAB Ectopic Multiple Living   6 3 3  3  3   0 3     Past Medical History  Diagnosis Date  . PONV (postoperative nausea and vomiting)   . Depression   . MVP (mitral valve prolapse)     Does not get pre-treated w/abx - no problem per pt  . Antiphospholipid antibody syndrome ([place]) 12/21/2013    Recurrent miscarriage; no thrombotic events  . Mitral valve prolapse 12/21/2013  . Migraine    Past Surgical History  Procedure Laterality Date  . Cesarean section    . Cholecystectomy    . Wisdom tooth extraction    . Rhinoplasty    . Abdominoplasty    . Dilation and evacuation  05/09/2012    Procedure: DILATATION AND EVACUATION;  Surgeon: Carol Mckenzie. Harrington Challenger, MD;  Location: [facility];  Service: Gynecology;  Laterality: N/A;   Family History: family history includes Suicidality in her father. Social History:  reports that she has never smoked. She has never used smokeless tobacco. She reports that she does not drink alcohol or use illicit drugs.   Prenatal Transfer Tool  Maternal Diabetes: No Genetic Screening: Normal Maternal Ultrasounds/Referrals: Normal Fetal Ultrasounds or other Referrals:  None Maternal Substance Abuse:  No Significant Maternal Medications:  None Significant Maternal Lab Results:  None Other Comments:  None  ROS  Dilation: 10 Effacement (%): 100 Station: -3 Exam by:: Carol Mckenzie Blood pressure 105/63, pulse 53, temperature 98 F (36.7 C), temperature source Oral, resp. rate 18, height 5\' 4"  (1.626 m), weight 167 lb (75.751 kg), last menstrual  period 06/01/2014, SpO2 98 %, unknown if currently breastfeeding. Exam Physical Exam  Prenatal labs: ABO, Rh: --/--/O POS, O POS (11/22 0106) Antibody: NEG (11/22 0106) Rubella: Immune (05/02 0000) RPR: Reactive (11/22 0106)  HBsAg: Negative (05/02 0000)  HIV: Non-reactive (05/02 0000)  GBS: Negative (10/28 0000)   Assessment/Plan: 1) Admit 2) pitocin 3) AROM 4) Epidural   Carol Mckenzie H. 03/09/2015, 10:48 AM

## 2015-03-11 ENCOUNTER — Ambulatory Visit (HOSPITAL_COMMUNITY)
Admission: RE | Admit: 2015-03-11 | Discharge: 2015-03-11 | Disposition: A | Discharge status: Home / Self Care | Payer: BLUE CROSS/BLUE SHIELD | Source: Ambulatory Visit | Attending: Obstetrics and Gynecology | Admitting: Obstetrics and Gynecology

## 2015-03-11 NOTE — Lactation Note (Signed)
Lactation Consult; Experienced BF mom with history of low milk supply. Reports nipples were very sore, bleeding but now getting better. Mom using cradle hold every feeding. Suggested using football hold occasionally to help nipples heal. Baby is tucking bottom lip under- untucked and mom reports that feels better. She is letting him slide to tip of nipple- encouraged to wait for wide open mouth and keep him close to the breast throughout the feeding. Only pumping 1 time/day- suggested pumping 4 times/day or more to increase milk supply. To continue supplementing whenever baby is still acting hungry after nursing. Comfort gels given with instructions for use. No further questions at present. To call for assist prn Smart Start to come net week. Visit with Ped the following week.   Mother's reason for visit:  Sore nipples- getting better- not making enough milk Visit Type:  Feeding assessment  Consult:  Initial Lactation Consultant:  Linus Mako D  ________________________________________________________________________  89 Name: Carol Mckenzie Date of Birth: 03/01/2015 Pediatrician: Carlis Abbott Gender: female Gestational Age: [redacted]w[redacted]d (At Birth) Birth Weight: 6 lb 13.9 oz (3116 g) Weight at Discharge: Weight: 6 lb 11.9 oz (3060 g)Date of Discharge: 03/02/2015 [facility] Weights   03/01/15 1116 03/02/15 0020  Weight: 6 lb 13.9 oz (3116 g) 6 lb 11.9 oz (3060 g)      Weight today: 2982 g   6- 9.2 ________________________________________________________________________  Mother's Name: Carol Mckenzie Type of delivery:   Breastfeeding Experience:  P3- low milk supply with 2 previous babies- only nursed a few [name]- back to work at 6 [name]- pumping never obtained much milk ________________________________________________________________________  Breastfeeding History (Post Discharge)  Frequency of breastfeeding:  Q 2-3 hours Duration of feeding:  5-30.min " Some comfort  nursing"  Supplementation  Formula:  Volume 20-30 ml Frequency:  Whenever he still acts hungry       Brand: Similac  Breastmilk:  Volume 30-45 ml Frequency:  whenever available  Method:  Bottle,   Pumping  Type of pump:  Spectra Frequency:  1 time/day Volume:  15-45 ml  Infant Intake and Output Assessment  Voids:  4-6 in 24 hrs.  Color:  Clear yellow Stools:  4-8 in 24 hrs.  Color:  Yellow  ________________________________________________________________________  Maternal Breast Assessment  Breast:  Soft Nipple:  Erect and healing scabs'   _______________________________________________________________________ Feeding Assessment/Evaluation  Initial feeding assessment:  I Positioning:  Cradle Right breast  LATCH documentation:  Latch:  2 = Grasps breast easily, tongue down, lips flanged, rhythmical sucking.  Audible swallowing:  1 = A few with stimulation  Type of nipple:  2 = Everted at rest and after stimulation  Comfort (Breast/Nipple):  1 = Filling, red/small blisters or bruises, mild/mod discomfort  Hold (Positioning):  1 = Assistance needed to correctly position infant at breast and maintain latch  LATCH score:  7  Attached assessment:  Deep  Lips flanged:  Yes.    Lips untucked:  Yes.    Suck assessment:  Displays both   Pre-feed weight: 2982 g   6-9.2 Post-feed weight:  2998 g   6-9.7  Amount transferred:  16 ml    Pre-feed weight: 2998 g 6-9.7 Post-feed weight:  3022 g    6- 10.5 Amount transferred:  24 ml    Total amount transferred:  38 ml

## 2015-04-21 ENCOUNTER — Other Ambulatory Visit: Payer: Self-pay | Admitting: Obstetrics and Gynecology

## 2015-04-22 LAB — CYTOLOGY - PAP

## 2016-05-07 ENCOUNTER — Other Ambulatory Visit: Payer: Self-pay | Admitting: Obstetrics and Gynecology

## 2016-05-08 LAB — CYTOLOGY - PAP

## 2017-08-26 ENCOUNTER — Other Ambulatory Visit: Payer: Self-pay

## 2017-08-26 ENCOUNTER — Ambulatory Visit (INDEPENDENT_AMBULATORY_CARE_PROVIDER_SITE_OTHER): Payer: BLUE CROSS/BLUE SHIELD

## 2017-08-26 ENCOUNTER — Ambulatory Visit: Payer: BLUE CROSS/BLUE SHIELD | Admitting: Podiatry

## 2017-08-26 ENCOUNTER — Other Ambulatory Visit: Payer: Self-pay | Admitting: Podiatry

## 2017-08-26 ENCOUNTER — Encounter: Payer: Self-pay | Admitting: Podiatry

## 2017-08-26 VITALS — BP 92/61 | HR 52 | Resp 16

## 2017-08-26 DIAGNOSIS — M79672 Pain in left foot: Secondary | ICD-10-CM

## 2017-08-26 DIAGNOSIS — R110 Nausea: Secondary | ICD-10-CM | POA: Insufficient documentation

## 2017-08-26 DIAGNOSIS — M7752 Other enthesopathy of left foot: Secondary | ICD-10-CM

## 2017-08-26 DIAGNOSIS — N912 Amenorrhea, unspecified: Secondary | ICD-10-CM | POA: Insufficient documentation

## 2017-08-26 DIAGNOSIS — M779 Enthesopathy, unspecified: Secondary | ICD-10-CM | POA: Diagnosis not present

## 2017-08-26 NOTE — Patient Instructions (Addendum)
Pre-Operative Instructions  Congratulations, you have decided to take an important step towards improving your quality of life.  You can be assured that the doctors and staff at [facility] will be with you every step of the way.  Here are some important things you should know:  1. Plan to be at the surgery center/hospital at least 1 (one) hour prior to your scheduled time, unless otherwise directed by the surgical center/hospital staff.  You must have a responsible adult accompany you, remain during the surgery and drive you home.  Make sure you have directions to the surgical center/hospital to ensure you arrive on time. 2. If you are having surgery at [facility] or [facility], you will need a copy of your medical history and physical form from your family physician within one month prior to the date of surgery. We will give you a form for your primary physician to complete.  3. We make every effort to accommodate the date you request for surgery.  However, there are times where surgery dates or times have to be moved.  We will contact you as soon as possible if a change in schedule is required.   4. No aspirin/ibuprofen for one week before surgery.  If you are on aspirin, any non-steroidal anti-inflammatory medications (Mobic, Aleve, Ibuprofen) should not be taken seven (7) days prior to your surgery.  You make take Tylenol for pain prior to surgery.  5. Medications - If you are taking daily heart and blood pressure medications, seizure, reflux, allergy, asthma, anxiety, pain or diabetes medications, make sure you notify the surgery center/hospital before the day of surgery so they can tell you which medications you should take or avoid the day of surgery. 6. No food or drink after midnight the night before surgery unless directed otherwise by surgical center/hospital staff. 7. No alcoholic beverages 24-hours prior to surgery.  No smoking 24-hours prior or 24-hours after  surgery. 8. Wear loose pants or shorts. They should be loose enough to fit over bandages, boots, and casts. 9. Don't wear slip-on shoes. Sneakers are preferred. 10. Bring your boot with you to the surgery center/hospital.  Also bring crutches or a walker if your physician has prescribed it for you.  If you do not have this equipment, it will be provided for you after surgery. 11. If you have not been contacted by the surgery center/hospital by the day before your surgery, call to confirm the date and time of your surgery. 12. Leave-time from work may vary depending on the type of surgery you have.  Appropriate arrangements should be made prior to surgery with your employer. 13. Prescriptions will be provided immediately following surgery by your doctor.  Fill these as soon as possible after surgery and take the medication as directed. Pain medications will not be refilled on weekends and must be approved by the doctor. 14. Remove nail polish on the operative foot and avoid getting pedicures prior to surgery. 15. Wash the night before surgery.  The night before surgery wash the foot and leg well with water and the antibacterial soap provided. Be sure to pay special attention to beneath the toenails and in between the toes.  Wash for at least three (3) minutes. Rinse thoroughly with water and dry well with a towel.  Perform this wash unless told not to do so by your physician.  Enclosed: 1 Ice pack (please put in freezer the night before surgery)   1 Hibiclens skin cleaner     Pre-op instructions  If you have any questions regarding the instructions, please do not hesitate to call our office.  [place]: [address], [place], [place] [address] -- [phone]  [place]: [address] [place], [place] [address] -- [phone]  [place]: [address]  [place], [place] [address] -- [phone]  [place]: [address], [address], [place], [place] [address] -- [phone]  Website:  [email or web address] Morton neuralgia is a type of foot pain in the area closest to your toes. This area is sometimes called the ball of your foot. Morton neuralgia occurs when a branch of a nerve in your foot (digital nerve) becomes compressed. When this happens over a long period of time, the nerve can thicken (neuroma) and cause pain. This usually occurs between the third and fourth toe. Morton neuralgia can come and go but may get worse over time. What are the causes? Your digital nerve can become compressed and stretched at a point where it passes under a thick band of tissue that connects your toes (intermetatarsal ligament). Morton neuralgia can be caused by mild repetitive damage in this area. This type of damage can result from:  Activities such as running or jumping.  Wearing shoes that are too tight.  What increases the risk? You may be at risk for Morton neuralgia if you:  Are female.  Wear high heels.  Wear shoes that are narrow or tight.  Participate in activities that stretch your toes. These include: ? Running. ? Ballet. ? Long-distance walking.  What are the signs or symptoms? The first symptom of Morton neuralgia is pain that spreads from the ball of your foot to your toes. It may feel like you are walking on a marble. Pain usually gets worse with walking and goes away at night. Other symptoms may include numbness and cramping of your toes. How is this diagnosed? Your health care provider will do a physical exam. When doing the exam, your health care provider may:  Squeeze your foot just behind your toe.  Ask you to move your toes to check for pain.  You may also have tests on your foot to confirm the diagnosis. These may include:  An X-ray.  An MRI.  How is this treated? Treatment for Morton neuralgia may be as simple as changing the kind of shoes you wear. Other treatments may include:  Wearing a supportive pad (orthosis) under the  front of your foot. This lifts your toe bones and takes pressure off the nerve.  Getting injections of numbing medicine and anti-inflammatory medicine (steroid) in the nerve.  Having surgery to remove part of the thickened nerve.  Follow these instructions at home:  Take medicine only as directed by your health care provider.  Wear soft-soled shoes with a wide toe area.  Stop activities that may be causing pain.  Elevate your foot when resting.  Massage your foot.  Apply ice to the injured area: ? Put ice in a plastic bag. ? Place a towel between your skin and the bag. ? Leave the ice on for 20 minutes, 2-3 times a day.  Keep all follow-up visits as directed by your health care provider. This is important. Contact a health care provider if:  Home care instructions are not helping you get better.  Your symptoms change or get worse. This information is not intended to replace advice given to you by your health care provider. Make sure you discuss any questions you have with your health care provider. Document   Released: 07/02/2000 Document Revised: 09/01/2015 Document Reviewed: 05/27/2013 Elsevier Interactive Patient Education  2018 Elsevier Inc.  

## 2017-08-26 NOTE — Progress Notes (Signed)
Subjective:   Patient ID: Carol Mckenzie, female   DOB: 47 y.o.   MRN: [id number]   HPI Patient presents with pain of the left foot and states it feels like there is a ball there and it comes and goes and is worse with a lighter type shoe or with certain activities.  Patient states this is been ongoing for several years and she is a runner and patient does not smoke and likes to be active   Review of Systems  All other systems reviewed and are negative.       Objective:  Physical Exam  Constitutional: She appears well-developed and well-nourished.  Cardiovascular: Intact distal pulses.  Pulmonary/Chest: Effort normal.  Musculoskeletal: Normal range of motion.  Neurological: She is alert.  Skin: Skin is warm.  Nursing note and vitals reviewed.   Neurovascular status found to be intact with muscle strength adequate.  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  At this point I do think that excision of the neuroma would be her best long-term that but I did discuss orthotics and injection treatment also.  Patient wants surgery and will need to do it at her convenience and at this time wants to do consent form.  I allowed her to go over consent form going over alternative treatments complications.  The actual surgery with tourniquet will only be approximately 15 to 20 minutes and she will weight-bear on it right away so I do not think there is risk of clotting with this particular procedure.  I discussed the total  recovery can take several months and there will be some persistent swelling but ultimately it should solve her problem and there will be some numbness between her toes.  She signed consent form and is encouraged to call us with any questions and will schedule the procedure when it works best for

## 2017-08-26 NOTE — Progress Notes (Signed)
Patient ID: Carol Mckenzie, female   DOB: Jun 03, 1970, 47 y.o.   MRN: [id number]

## 2017-08-26 NOTE — Progress Notes (Signed)
   Subjective:    Patient ID: Carol Mckenzie, female    DOB: Aug 23, 1970, 47 y.o.   MRN: [id number]  HPI    Review of Systems  All other systems reviewed and are negative.      Objective:   Physical Exam        Assessment & Plan:

## 2017-08-28 ENCOUNTER — Telehealth: Payer: Self-pay | Admitting: *Deleted

## 2017-08-28 NOTE — Telephone Encounter (Addendum)
"  I'm calling to make my surgery appointment with Dr. Paulla Dolly.  If you could call me back."  I called the patient back.  We will schedule surgery for October 08, 2017.

## 2017-09-19 ENCOUNTER — Telehealth: Payer: Self-pay | Admitting: *Deleted

## 2017-09-19 NOTE — Telephone Encounter (Signed)
I attempted to return her call.  I could not leave a message because her mailbox was full.  I was calling to tell her that [place] does not require authorization for her surgery.  She has a $5000 deductible, nothing has been met towards it.  Her insurance will cover 70% and her co-pay is 30%.  Jocelyn Lamer will have to call her with an estimate.

## 2017-09-19 NOTE — Telephone Encounter (Signed)
"  I'm scheduled to have surgery with Dr. Paulla Dolly for July 1.  I'm calling to see if my pre-authorization from my insurance has come through and what will my share of the bill probably going to be.  Call me, you can leave a message."

## 2017-09-19 NOTE — Telephone Encounter (Signed)
"  I had called you earlier and I believe you tried to call me back but I was in the room with a patient.  "Yes, I was calling to tell you that [place] does not require authorization for your surgery.  You have a $5000 deductible and zero has been met. Once that deductible has been met, [employer] will cover at 70% and you will have a co-insurance of 30%.  I cannot give you an estimate, Jocelyn Lamer in [employer] department does that and she is out of the office until next week.  "Okay, thank you so much."

## 2017-10-08 DIAGNOSIS — G5762 Lesion of plantar nerve, left lower limb: Secondary | ICD-10-CM | POA: Diagnosis not present

## 2017-10-15 ENCOUNTER — Encounter: Payer: Self-pay | Admitting: Podiatry

## 2017-10-16 ENCOUNTER — Other Ambulatory Visit: Payer: BLUE CROSS/BLUE SHIELD

## 2017-10-18 ENCOUNTER — Ambulatory Visit (INDEPENDENT_AMBULATORY_CARE_PROVIDER_SITE_OTHER): Payer: BLUE CROSS/BLUE SHIELD | Admitting: Podiatry

## 2017-10-18 ENCOUNTER — Encounter

## 2017-10-18 VITALS — Temp 98.1°F

## 2017-10-18 DIAGNOSIS — D3610 Benign neoplasm of peripheral nerves and autonomic nervous system, unspecified: Secondary | ICD-10-CM

## 2017-10-21 NOTE — Progress Notes (Signed)
Subjective: Carol Mckenzie is a 47 y.o. is seen today in office s/p left third interspace neurectomy preformed by Dr. Paulla Dolly. They state their pain is controlled.  She states that she has showering on the area wet although is done well.  She has no pain.. Denies any systemic complaints such as fevers, chills, nausea, vomiting. No calf pain, chest pain, shortness of breath.   Objective: General: No acute distress, AAOx3  DP/PT pulses palpable 2/4, CRT < 3 sec to all digits.  Protective sensation intact. Motor function intact.  LEFT foot: Incision is well coapted without any evidence of dehiscence and sutures are intact. There is faint surrounding erythema however this is more from inflammation as opposed to infection and there is no ascending cellulitis, fluctuance, crepitus, malodor, drainage/purulence. There is minimal edema around the surgical site. There is no pain along the surgical site.  No other areas of tenderness to bilateral lower extremities.  No other open lesions or pre-ulcerative lesions.  No pain with calf compression, swelling, warmth, erythema.   Assessment and Plan:  Status post left foot neurectomy, doing well with no complications   -Treatment options discussed including all alternatives, risks, and complications -She is noting in the area wet keeping the area clean.  Incision appears to be healing well.  She can continue with this I want her to apply small amount of antibiotic ointment and a dressing daily.  Continue with surgical shoe. -Ice/elevation -Pain medication as needed. -Monitor for any clinical signs or symptoms of infection and DVT/PE and directed to call the office immediately should any occur or go to the ER. -Follow-up in 2 weeks with Dr. Paulla Dolly or sooner if any problems arise. In the meantime, encouraged to call the office with any questions, concerns, change in symptoms.   Celesta Gentile, DPM

## 2017-10-28 ENCOUNTER — Telehealth: Payer: Self-pay | Admitting: Podiatry

## 2017-10-28 NOTE — Telephone Encounter (Signed)
The surgical site looks infected so I started taking antibiotics over the weekend. 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 I would like to speak with the Nurse.

## 2017-11-04 ENCOUNTER — Ambulatory Visit (INDEPENDENT_AMBULATORY_CARE_PROVIDER_SITE_OTHER): Payer: BLUE CROSS/BLUE SHIELD | Admitting: Podiatry

## 2017-11-04 ENCOUNTER — Encounter: Payer: Self-pay | Admitting: Podiatry

## 2017-11-04 DIAGNOSIS — D3610 Benign neoplasm of peripheral nerves and autonomic nervous system, unspecified: Secondary | ICD-10-CM

## 2017-11-04 NOTE — Progress Notes (Signed)
Subjective:   Patient ID: Carol Mckenzie, female   DOB: 47 y.o.   MRN: [id number]   HPI Patient states she is not in any pain and she is walking pretty well and wearing shoe gear appropriately   ROS      Objective:  Physical Exam  Neurovascular status intact negative Homans sign noted with patient's wound edges left healing well well coapted with no drainage and minimal discomfort with no indications of neuroma symptomatology.  Mild numbness between the toes     Assessment:  Doing well post neurectomy third interspace left     Plan:  Discussed continued conservative treatment and explained what to do if any increased pain were to occur swelling and the numbness should get better but will probably have residual low-grade numbness.  Discharged unless she needs to see [place]

## 2018-01-03 ENCOUNTER — Encounter: Payer: Self-pay | Admitting: Podiatry

## 2018-01-03 NOTE — Progress Notes (Signed)
DOS 10/08/2017 Neurectomy 3rd LT

## 2018-01-20 ENCOUNTER — Ambulatory Visit (HOSPITAL_BASED_OUTPATIENT_CLINIC_OR_DEPARTMENT_OTHER)
Admission: RE | Admit: 2018-01-20 | Discharge: 2018-01-20 | Disposition: A | Discharge status: Home / Self Care | Payer: BLUE CROSS/BLUE SHIELD | Source: Ambulatory Visit

## 2018-01-20 ENCOUNTER — Ambulatory Visit (HOSPITAL_COMMUNITY): Admission: RE | Admit: 2018-01-20 | Payer: BLUE CROSS/BLUE SHIELD | Source: Ambulatory Visit

## 2018-01-20 ENCOUNTER — Ambulatory Visit (HOSPITAL_COMMUNITY)
Admission: RE | Admit: 2018-01-20 | Discharge: 2018-01-20 | Disposition: A | Discharge status: Home / Self Care | Payer: BLUE CROSS/BLUE SHIELD | Source: Ambulatory Visit | Attending: Internal Medicine | Admitting: Internal Medicine

## 2018-01-20 VITALS — BP 103/68 | HR 77 | Wt 147.6 lb

## 2018-01-20 DIAGNOSIS — D6861 Antiphospholipid syndrome: Secondary | ICD-10-CM | POA: Insufficient documentation

## 2018-01-20 DIAGNOSIS — R079 Chest pain, unspecified: Secondary | ICD-10-CM | POA: Diagnosis not present

## 2018-01-20 DIAGNOSIS — Z791 Long term (current) use of non-steroidal anti-inflammatories (NSAID): Secondary | ICD-10-CM | POA: Diagnosis not present

## 2018-01-20 DIAGNOSIS — Z885 Allergy status to narcotic agent status: Secondary | ICD-10-CM | POA: Diagnosis not present

## 2018-01-20 DIAGNOSIS — R9431 Abnormal electrocardiogram [ECG] [EKG]: Secondary | ICD-10-CM | POA: Diagnosis not present

## 2018-01-20 DIAGNOSIS — F329 Major depressive disorder, single episode, unspecified: Secondary | ICD-10-CM | POA: Diagnosis not present

## 2018-01-20 DIAGNOSIS — R55 Syncope and collapse: Secondary | ICD-10-CM | POA: Diagnosis not present

## 2018-01-20 DIAGNOSIS — R002 Palpitations: Secondary | ICD-10-CM | POA: Diagnosis not present

## 2018-01-20 DIAGNOSIS — I341 Nonrheumatic mitral (valve) prolapse: Secondary | ICD-10-CM | POA: Insufficient documentation

## 2018-01-20 DIAGNOSIS — I340 Nonrheumatic mitral (valve) insufficiency: Secondary | ICD-10-CM | POA: Insufficient documentation

## 2018-01-20 DIAGNOSIS — R001 Bradycardia, unspecified: Secondary | ICD-10-CM | POA: Diagnosis not present

## 2018-01-20 DIAGNOSIS — Z79899 Other long term (current) drug therapy: Secondary | ICD-10-CM | POA: Insufficient documentation

## 2018-01-20 DIAGNOSIS — Z7982 Long term (current) use of aspirin: Secondary | ICD-10-CM | POA: Diagnosis not present

## 2018-01-20 DIAGNOSIS — Z79891 Long term (current) use of opiate analgesic: Secondary | ICD-10-CM | POA: Diagnosis not present

## 2018-01-20 DIAGNOSIS — R42 Dizziness and giddiness: Secondary | ICD-10-CM | POA: Diagnosis not present

## 2018-01-20 NOTE — Progress Notes (Signed)
  Echocardiogram  Echocardiogram Stress Test has been performed.  [name] M 01/20/2018, 3:29 PM

## 2018-01-20 NOTE — Progress Notes (Signed)
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  No known cardiac h/o except for MVP. About 2 months ago started working out much harder. Has always done Pilates and been active but 2 months ago became much more serious about her running again after her last pregnancy. Resting HR has typically been in 50s but now it is in low 40s.   About 1 month ago started not feeling great after running. Over the last 2-3 weeks notes that it is harder to run. Gets heaviness in her chest at times - not reproducible but intermittent. This Saturday was at the beach with friends and ran 7 miles and had some heavinees in her chest and heaviness down her arm during the run. Finished the 7-mile run. Symptoms lasted 20-30 minutes then resolved. Also felt dizzy and presyncopal. Did not drink during her run. No h/o GERD. Since that time she has stopped spiro 50 (on for acne) and proponolol (for essential tremor)  Now still feels weak and dizzy a little. + orthostasis.    Review of Systems: [y] = yes, [ ]  = no   General: Weight gain [ ] ; Weight loss [ ] ; Anorexia [ ] ; Fatigue [ ] ; Fever [ ] ; Chills [ ] ; Weakness [ ]   Cardiac: Chest pain/pressure [ y]; Resting SOB [ ] ; Exertional SOB [ ] ; Orthopnea [ ] ; Pedal Edema [ ] ; Palpitations [ ] ; Syncope [ ] ; Presyncope Blue.Reese ]; Paroxysmal nocturnal dyspnea[ ]   Pulmonary: Cough [ ] ; Wheezing[ ] ; Hemoptysis[ ] ; Sputum [ ] ; Snoring [ ]   GI: Vomiting[ ] ; Dysphagia[ ] ; Melena[ ] ; Hematochezia [ ] ; Heartburn[ ] ; Abdominal pain [ ] ; Constipation [ ] ; Diarrhea [ ] ; BRBPR [ ]   GU: Hematuria[ ] ; Dysuria [ ] ; Nocturia[ ]   Vascular: Pain in legs with walking [ ] ; Pain in feet with lying flat [ ] ; Non-healing sores [ ] ; Stroke [ ] ; TIA [ ] ; Slurred speech [ ] ;  Neuro: Headaches[ ] ; Vertigo[ ] ; Seizures[ ] ; Paresthesias[ ] ;Blurred vision [ ] ; Diplopia [ ] ; Vision changes [ ]     Ortho/Skin: Arthritis [ ] ; Joint pain [ ] ; Muscle pain [ ] ; Joint swelling [ ] ; Back Pain [ ] ; Rash [ ]   Psych: Depression[ ] ; Anxiety[ ]   Heme: Bleeding problems [ ] ; Clotting disorders [ ] ; Anemia [ ]   Endocrine: Diabetes [ ] ; Thyroid dysfunction[ ]    Past Medical History:  Diagnosis Date  . 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 Depression   . Migraine   . Mitral valve prolapse 12/21/2013  . MVP (mitral valve prolapse)    Does not get pre-treated w/abx - no problem per pt  . PONV (postoperative nausea and vomiting)     Current Outpatient Medications  Medication Sig Dispense Refill  . aspirin EC 81 MG tablet Take 81 mg by mouth daily.    Marland Kitchen docusate sodium (COLACE) 100 MG capsule Take 1 capsule (100 mg total) by mouth 2 (two) times daily. 60 capsule 0  . enoxaparin (LOVENOX) 40 MG/0.4ML injection Inject 40 mg into the skin daily.    Marland Kitchen FLUoxetine (PROZAC) 20 MG capsule Take 20 mg by mouth daily.    Marland Kitchen ibuprofen (ADVIL,MOTRIN) 600 MG tablet Take 1 tablet (600 mg total) by mouth every 6 (six) hours as needed. 90 tablet 0  . levonorgestrel (MIRENA, 52 MG,) 20 MCG/24HR  IUD Mirena 20 mcg/24 hours (5 yrs) 52 mg intrauterine device  Take 1 device by intrauterine route.    . mupirocin nasal ointment (BACTROBAN) 2 % mupirocin 2 % topical ointment    . oxyCODONE-acetaminophen (ROXICET) 5-325 MG tablet Take 1-2 tablets by mouth every 4 (four) hours as needed for severe pain. 30 tablet 0  . Prenatal Vit-Fe Fumarate-FA (PRENATAL MULTIVITAMIN) TABS tablet Take 1 tablet by mouth daily at 12 noon.     No current facility-administered medications for this encounter.     Allergies  Allergen Reactions  . Other Hives    Reaction to unknown narcotic post C-section      Social History   Socioeconomic History  . Marital status: Married    Spouse name: Vonna Kotyk  . Number of children: 2  . Years of education: MD  . Highest education  level: Not on file  Occupational History    Employer: OTHER    Comment: Dermatology Specialist  Social Needs  . Financial resource strain: Not on file  . Food insecurity:    Worry: Not on file    Inability: Not on file  . Transportation needs:    Medical: Not on file    Non-medical: Not on file  Tobacco Use  . Smoking status: Never Smoker  . Smokeless tobacco: Never Used  Substance and Sexual Activity  . Alcohol use: No    Alcohol/week: 0.0 standard drinks  . Drug use: No  . Sexual activity: Yes    Birth control/protection: None    Comment: approx [redacted] wks gestation  Lifestyle  . Physical activity:    Days per week: Not on file    Minutes per session: Not on file  . Stress: Not on file  Relationships  . Social connections:    Talks on phone: Not on file    Gets together: Not on file    Attends religious service: Not on file    Active member of club or organization: Not on file    Attends meetings of clubs or organizations: Not on file    Relationship status: Not on file  . Intimate partner violence:    Fear of current or ex partner: Not on file    Emotionally abused: Not on file    Physically abused: Not on file    Forced sexual activity: Not on file  Other Topics Concern  . Not on file  Social History Narrative   Patient lives at home with family.   Caffeine use: 1 cup daily      Family History  Problem Relation Age of Onset  . Suicidality Father    Father with CAD at autopsy at 62 No other family history of CAD Mother 28  DM2, HL   Vitals:   01/20/18 1318  BP: 103/68  Pulse: 77  SpO2: 97%  Weight: 67 kg (147 lb 9.6 oz)    PHYSICAL EXAM: General:  Well appearing. No respiratory difficulty HEENT: normal Neck: supple. no JVD. Carotids 2+ bilat; no bruits. No lymphadenopathy or thryomegaly appreciated. Cor: PMI nondisplaced. Regular rate & rhythm. No rubs, gallops or murmurs. Lungs: clear Abdomen: soft, nontender, nondistended. No hepatosplenomegaly.  No bruits or masses. Good bowel sounds. Extremities: no cyanosis, clubbing, rash, edema Neuro: alert & oriented x 3, cranial nerves grossly intact. moves all 4 extremities w/o difficulty. Affect pleasant.  ECG: NSR 70 No ST-T wave abnormalities. (Personally reviewed)   ASSESSMENT & PLAN:  1. Exertional CP and arm pain with presyncope -  I suspect the main issue here is volume depletion but given exertional nature of CP with arm discomfort needs additional ischemic evaluation. Will proceed with stress echo - Counseled on need for better hydration with exercise. Ok to restart spiro as long as she drinks sufficiently to maintain hydration.   2. Marked bradycardia with presyncope - Resting HR here today is 70 bpm but now off propranolol - HRs on Apple Watch have been in low 40s.  - Will place ZIoPatch  3. MVP with mild MR - repeat echo  Glori Bickers, MD  9:34 PM

## 2019-09-25 ENCOUNTER — Encounter (HOSPITAL_COMMUNITY): Payer: Self-pay | Admitting: Emergency Medicine

## 2019-09-25 ENCOUNTER — Emergency Department (HOSPITAL_COMMUNITY): Payer: BC Managed Care – PPO

## 2019-09-25 ENCOUNTER — Emergency Department (HOSPITAL_COMMUNITY)
Admission: EM | Admit: 2019-09-25 | Discharge: 2019-09-25 | Disposition: A | Discharge status: Home / Self Care | Payer: BC Managed Care – PPO | Attending: Emergency Medicine | Admitting: Emergency Medicine

## 2019-09-25 ENCOUNTER — Other Ambulatory Visit: Payer: Self-pay

## 2019-09-25 DIAGNOSIS — Z7901 Long term (current) use of anticoagulants: Secondary | ICD-10-CM | POA: Insufficient documentation

## 2019-09-25 DIAGNOSIS — R42 Dizziness and giddiness: Secondary | ICD-10-CM | POA: Diagnosis not present

## 2019-09-25 DIAGNOSIS — R519 Headache, unspecified: Secondary | ICD-10-CM | POA: Insufficient documentation

## 2019-09-25 DIAGNOSIS — Z7982 Long term (current) use of aspirin: Secondary | ICD-10-CM | POA: Diagnosis not present

## 2019-09-25 DIAGNOSIS — R110 Nausea: Secondary | ICD-10-CM | POA: Diagnosis present

## 2019-09-25 LAB — COMPREHENSIVE METABOLIC PANEL
ALT: 17 U/L (ref 0–44)
AST: 21 U/L (ref 15–41)
Albumin: 4 g/dL (ref 3.5–5.0)
Alkaline Phosphatase: 46 U/L (ref 38–126)
Anion gap: 10 (ref 5–15)
BUN: 14 mg/dL (ref 6–20)
CO2: 24 mmol/L (ref 22–32)
Calcium: 8.9 mg/dL (ref 8.9–10.3)
Chloride: 101 mmol/L (ref 98–111)
Creatinine, Ser: 0.63 mg/dL (ref 0.44–1.00)
GFR calc Af Amer: >60 mL/min (ref >60)
GFR calc non Af Amer: >60 mL/min (ref >60)
Glucose, Bld: 105 mg/dL — ABNORMAL HIGH (ref 70–99)
Potassium: 3.8 mmol/L (ref 3.5–5.1)
Sodium: 135 mmol/L (ref 135–145)
Total Bilirubin: 0.6 mg/dL (ref 0.3–1.2)
Total Protein: 6.9 g/dL (ref 6.5–8.1)

## 2019-09-25 LAB — I-STAT BETA HCG BLOOD, ED (MC, WL, AP ONLY): I-stat hCG, quantitative: <5 m[IU]/mL (ref <5)

## 2019-09-25 LAB — CBC
HCT: 40.9 % (ref 36.0–46.0)
Hemoglobin: 13.6 g/dL (ref 12.0–15.0)
MCH: 32.6 pg (ref 26.0–34.0)
MCHC: 33.3 g/dL (ref 30.0–36.0)
MCV: 98.1 fL (ref 80.0–100.0)
Platelets: 243 10*3/uL (ref 150–400)
RBC: 4.17 MIL/uL (ref 3.87–5.11)
RDW: 12.6 % (ref 11.5–15.5)
WBC: 7.9 10*3/uL (ref 4.0–10.5)
nRBC: 0 % (ref 0.0–0.2)

## 2019-09-25 LAB — LIPASE, BLOOD: Lipase: 27 U/L (ref 11–51)

## 2019-09-25 MED ORDER — MECLIZINE HCL 25 MG PO TABS
25.0000 mg | ORAL_TABLET | Freq: Three times a day (TID) | ORAL | 0 refills | Status: DC | PRN
Start: 2019-09-25 — End: 2020-10-24

## 2019-09-25 MED ORDER — ONDANSETRON 4 MG PO TBDP
4.0000 mg | ORAL_TABLET | Freq: Once | ORAL | Status: AC | PRN
  Administered 2019-09-25: 4 mg via ORAL
  Filled 2019-09-25: qty 1

## 2019-09-25 MED ORDER — METOCLOPRAMIDE HCL 10 MG PO TABS
10.0000 mg | ORAL_TABLET | Freq: Once | ORAL | Status: AC
  Administered 2019-09-25: 10 mg via ORAL
  Filled 2019-09-25: qty 1

## 2019-09-25 MED ORDER — METOCLOPRAMIDE HCL 10 MG PO TABS
10.0000 mg | ORAL_TABLET | Freq: Four times a day (QID) | ORAL | 0 refills | Status: DC | PRN
Start: 2019-09-25 — End: 2020-10-24

## 2019-09-25 MED ORDER — SODIUM CHLORIDE 0.9% FLUSH: 3.0000 mL | Freq: Once | INTRAVENOUS | Status: DC

## 2019-09-25 MED ORDER — LORAZEPAM 0.5 MG PO TABS: 0.5000 mg | ORAL_TABLET | Freq: Three times a day (TID) | ORAL | 0 refills | Status: DC | PRN

## 2019-09-25 MED ORDER — MECLIZINE HCL 25 MG PO TABS
25.0000 mg | ORAL_TABLET | Freq: Once | ORAL | Status: AC
  Administered 2019-09-25: 25 mg via ORAL
  Filled 2019-09-25: qty 1

## 2019-09-25 NOTE — ED Notes (Signed)
Pt transported to MRI 

## 2019-09-25 NOTE — ED Triage Notes (Addendum)
Patient had episode of vertigo two days ago. Patient woke up at 2:30 and felt swimmy headed. Patient states that she had humming in her ears. Off balance when she tries to walk per EMS. Patient was feeling nauseated.

## 2019-09-25 NOTE — ED Provider Notes (Signed)
[facility] DEPT Provider Note   CSN: [id number] Arrival date & time: 09/25/19  0340   History Chief Complaint  Patient presents with  . Nausea    Carol Mckenzie is a 49 y.o. female.  The history is provided by the patient.  She has history of migraine, antiphospholipid antibody syndrome and comes in because of dizziness.  2 days ago, she woke up with a sense of the room spinning with intense nausea.  She took oral meclizine and ondansetron and symptoms gradually resolved over about 10 hours.  She was doing well until tonight when she woke up with a sense of disequilibrium.  She feels as if she is falling and has not been able to walk.  There is associated nausea, but not as severe.  She also noted a humming in her ear.  There has been a mild headache, but not typical of her migraines.  She feels much worse when she stands up.  She has not taken any medication tonight.  Past Medical History:  Diagnosis Date  . 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 Depression   . Migraine   . Mitral valve prolapse 12/21/2013  . MVP (mitral valve prolapse)    Does not get pre-treated w/abx - no problem per pt  . PONV (postoperative nausea and vomiting)     Patient Active Problem List   Diagnosis Date Noted  . Amenorrhea 08/26/2017  . Nausea 08/26/2017  . Antiphospholipid antibody syndrome complicating pregnancy ([place]) 03/01/2015  . Spontaneous vaginal delivery 03/01/2015  . 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 Mitral valve prolapse 12/21/2013  . Abdominal pregnancy with intrauterine pregnancy 12/21/2013  . KNEE PAIN, RIGHT 05/10/2008  . BUNIONETTE 05/10/2008  . FOOT PAIN, RIGHT 05/10/2008  . HYPERSOMNIA, PERSISTENT 07/14/2007    Past Surgical History:  Procedure Laterality Date  . ABDOMINOPLASTY    . CESAREAN SECTION    . CHOLECYSTECTOMY    . DILATION AND EVACUATION  05/09/2012    Procedure: DILATATION AND EVACUATION;  Surgeon: Farrel Gobble. Harrington Challenger, MD;  Location: [facility];  Service: Gynecology;  Laterality: N/A;  . RHINOPLASTY    . WISDOM TOOTH EXTRACTION       OB History    Gravida  6   Para  3   Term  3   Preterm      AB  3   Living  3     SAB  3   TAB      Ectopic      Multiple  0   Live Births  3           Family History  Problem Relation Age of Onset  . Suicidality Father     Social History   Tobacco Use  . Smoking status: Never Smoker  . Smokeless tobacco: Never Used  Substance Use Topics  . Alcohol use: No    Alcohol/week: 0.0 standard drinks  . Drug use: No    Home Medications Prior to Admission medications   Medication Sig Start Date End Date Taking? Authorizing Provider  aspirin EC 81 MG tablet Take 81 mg by mouth daily.    [provider]  docusate sodium (COLACE) 100 MG capsule Take 1 capsule (100 mg total) by mouth 2 (two) times daily. 03/02/15   Vanessa Kick, MD  enoxaparin (LOVENOX) 40 MG/0.4ML injection Inject 40 mg into the skin daily.    [provider]  FLUoxetine (PROZAC)  20 MG capsule Take 20 mg by mouth daily.    [provider]  ibuprofen (ADVIL,MOTRIN) 600 MG tablet Take 1 tablet (600 mg total) by mouth every 6 (six) hours as needed. 03/02/15   Vanessa Kick, MD  levonorgestrel (MIRENA, 52 MG,) 20 MCG/24HR IUD Mirena 20 mcg/24 hours (5 yrs) 52 mg intrauterine device  Take 1 device by intrauterine route.    [provider]  mupirocin nasal ointment (BACTROBAN) 2 % mupirocin 2 % topical ointment    [provider]  oxyCODONE-acetaminophen (ROXICET) 5-325 MG tablet Take 1-2 tablets by mouth every 4 (four) hours as needed for severe pain. 03/02/15   Vanessa Kick, MD  Prenatal Vit-Fe Fumarate-FA (PRENATAL MULTIVITAMIN) TABS tablet Take 1 tablet by mouth daily at 12 noon.    [provider]    Allergies    Other  Review of Systems   Review of Systems  All other  systems reviewed and are negative.   Physical Exam Updated Vital Signs Pulse 73   Temp 97.7 F (36.5 C) (Oral)   Resp 18   Ht 5\' 4"  (1.626 m)   Wt 67.6 kg   BMI 25.58 kg/m   Physical Exam Vitals and nursing note reviewed.   49 year old female, resting comfortably and in no acute distress. Vital signs are normal. Oxygen saturation is 96%, which is normal. Head is normocephalic and atraumatic. PERRLA, EOMI. Oropharynx is clear.  There is no nystagmus. Neck is nontender and supple without adenopathy or JVD.  There are no carotid bruits. Back is nontender and there is no CVA tenderness. Lungs are clear without rales, wheezes, or rhonchi. Chest is nontender. Heart has regular rate and rhythm without murmur. Abdomen is soft, flat, nontender without masses or hepatosplenomegaly and peristalsis is normoactive. Extremities have no cyanosis or edema, full range of motion is present. Skin is warm and dry without rash. Neurologic: Mental status is normal, cranial nerves are intact, there are no motor or sensory deficits.  Finger-to-nose testing is normal.  On Romberg testing, she is generally unsteady but does not fall, nor does she tend to list in any particular direction.  Symptoms are not reproduced by passive head movement.  ED Results / Procedures / Treatments   Labs (all labs ordered are listed, but only abnormal results are displayed) Labs Reviewed  COMPREHENSIVE METABOLIC PANEL - Abnormal; Notable for the following components:      Result Value   Glucose, Bld 105 (*)    All other components within normal limits  LIPASE, BLOOD  CBC  URINALYSIS, ROUTINE W REFLEX MICROSCOPIC  I-STAT BETA HCG BLOOD, ED ([facility], [facility], [facility] ONLY)   Radiology MR BRAIN WO CONTRAST  Result Date: 09/25/2019 CLINICAL DATA:  49 year old female with sudden onset dizziness/vertigo at 0200 hours. Episode of vertigo also two days ago. EXAM: MRI HEAD WITHOUT CONTRAST TECHNIQUE: Multiplanar, multiecho pulse sequences  of the brain and surrounding structures were obtained without intravenous contrast. COMPARISON:  Brain MRI 03/15/2014. FINDINGS: Brain: No restricted diffusion to suggest acute infarction. No midline shift, mass effect, evidence of mass lesion, ventriculomegaly, extra-axial collection or acute intracranial hemorrhage. Cervicomedullary junction and pituitary are within normal limits. Pearline Cables and white matter signal is stable and within normal limits. No encephalomalacia or chronic cerebral blood products identified. Vascular: Major intracranial vascular flow voids are stable since 2015. Skull and upper cervical spine: Negative visible cervical spine, bone marrow signal. Sinuses/Orbits: Stable and negative. Other: Mastoids remain clear. Visible internal auditory structures appear normal.  Scalp and face soft tissues appear negative. IMPRESSION: Stable since 2015 and normal noncontrast MRI appearance of the brain. Electronically Signed   By: Genevie Ann M.D.   On: 09/25/2019 07:35    Procedures Procedures  Medications Ordered in ED Medications  sodium chloride flush (NS) 0.9 % injection 3 mL (has no administration in time range)  ondansetron (ZOFRAN-ODT) disintegrating tablet 4 mg (has no administration in time range)    ED Course  I have reviewed the triage vital signs and the nursing notes.  Pertinent labs & imaging results that were available during my care of the patient were reviewed by me and considered in my medical decision making (see chart for details).  MDM Rules/Calculators/A&P Episode of vertigo with disequilibrium today.  There aspects that seem typical of peripheral vertigo, but it is not clear whether this is central or peripheral.  CBC is normal, metabolic panel is normal.  She will be given a dose of meclizine and sent for MRI of the brain.  Old records are reviewed, and she has no relevant past visits.  MRI is unremarkable.  She does not feel any better following meclizine.  I recommended  lorazepam but she is concerned about her ability to function on a benzodiazepine.  She is discharged with prescriptions for meclizine, lorazepam and also metoclopramide to use as needed for vertigo.  Follow-up with PCP.  Final Clinical Impression(s) / ED Diagnoses Final diagnoses:  Dysequilibrium    Rx / DC Orders ED Discharge Orders    None       Delora Fuel, MD 30/94/07 [phone]

## 2019-09-25 NOTE — ED Notes (Signed)
Beta result is <5.0 IU/L

## 2020-04-01 ENCOUNTER — Ambulatory Visit
Admission: EM | Admit: 2020-04-01 | Discharge: 2020-04-01 | Disposition: A | Discharge status: Home / Self Care | Payer: BC Managed Care – PPO | Attending: Emergency Medicine | Admitting: Emergency Medicine

## 2020-04-01 ENCOUNTER — Ambulatory Visit (INDEPENDENT_AMBULATORY_CARE_PROVIDER_SITE_OTHER): Payer: BC Managed Care – PPO

## 2020-04-01 ENCOUNTER — Encounter: Payer: Self-pay | Admitting: Emergency Medicine

## 2020-04-01 ENCOUNTER — Other Ambulatory Visit: Payer: Self-pay

## 2020-04-01 DIAGNOSIS — J069 Acute upper respiratory infection, unspecified: Secondary | ICD-10-CM | POA: Diagnosis not present

## 2020-04-01 DIAGNOSIS — R059 Cough, unspecified: Secondary | ICD-10-CM | POA: Diagnosis not present

## 2020-04-01 MED ORDER — AZITHROMYCIN 250 MG PO TABS
250.0000 mg | ORAL_TABLET | Freq: Every day | ORAL | 0 refills | Status: DC
Start: 2020-04-01 — End: 2020-10-24

## 2020-04-01 MED ORDER — BENZONATATE 200 MG PO CAPS
200.0000 mg | ORAL_CAPSULE | Freq: Three times a day (TID) | ORAL | 0 refills | Status: AC | PRN
Start: 2020-04-01 — End: 2020-04-08

## 2020-04-01 NOTE — ED Provider Notes (Signed)
[facility]    CSN: [id number] Arrival date & time: 04/01/20  1205      History   Chief Complaint Chief Complaint  Patient presents with  . Cough    HPI Carol Mckenzie is a 49 y.o. female history of antiphospholipid syndrome, presenting today for evaluation of cough and congestion.  Patient reports that she has had URI symptoms with cough for approximately 4 days.  She initially had sore throat but this is resolved.  Concerned that symptoms have worsened in the past 2 days.  Has had 2 Covid test which were negative.  Has felt feverish, but no known measured fevers.  Using ibuprofen and TheraFlu.  Symptoms mainly in chest.  Denies history of asthma, takes daily allergy medicine.  HPI  Past Medical History:  Diagnosis Date  . Antiphospholipid antibody syndrome ([place]) 12/21/2013   Recurrent miscarriage; no thrombotic events  . Depression   . Migraine   . Mitral valve prolapse 12/21/2013  . MVP (mitral valve prolapse)    Does not get pre-treated w/abx - no problem per pt  . PONV (postoperative nausea and vomiting)     Patient Active Problem List   Diagnosis Date Noted  . Amenorrhea 08/26/2017  . Nausea 08/26/2017  . Antiphospholipid antibody syndrome complicating pregnancy ([place]) 03/01/2015  . Spontaneous vaginal delivery 03/01/2015  . Antiphospholipid antibody syndrome ([place]) 12/21/2013  . Mitral valve prolapse 12/21/2013  . Abdominal pregnancy with intrauterine pregnancy 12/21/2013  . KNEE PAIN, RIGHT 05/10/2008  . BUNIONETTE 05/10/2008  . FOOT PAIN, RIGHT 05/10/2008  . HYPERSOMNIA, PERSISTENT 07/14/2007    Past Surgical History:  Procedure Laterality Date  . ABDOMINOPLASTY    . CESAREAN SECTION    . CHOLECYSTECTOMY    . DILATION AND EVACUATION  05/09/2012   Procedure: DILATATION AND EVACUATION;  Surgeon: Farrel Gobble. Harrington Challenger, MD;  Location: [facility];  Service: Gynecology;  Laterality: N/A;  . RHINOPLASTY    . WISDOM TOOTH EXTRACTION      OB History    Gravida   6   Para  3   Term  3   Preterm      AB  3   Living  3     SAB  3   IAB      Ectopic      Multiple  0   Live Births  3            Home Medications    Prior to Admission medications   Medication Sig Start Date End Date Taking? Authorizing Provider  aspirin EC 81 MG tablet Take 81 mg by mouth daily.    [provider]  azithromycin (ZITHROMAX) 250 MG tablet Take 1 tablet (250 mg total) by mouth daily. Take first 2 tablets together, then 1 every day until finished. 04/01/20   Clydine Parkison C, PA-C  benzonatate (TESSALON) 200 MG capsule Take 1 capsule (200 mg total) by mouth 3 (three) times daily as needed for up to 7 days for cough. 04/01/20 04/08/20  Lucill Mauck C, PA-C  docusate sodium (COLACE) 100 MG capsule Take 1 capsule (100 mg total) by mouth 2 (two) times daily. 03/02/15   Vanessa Kick, MD  FLUoxetine (PROZAC) 20 MG capsule Take 20 mg by mouth daily.    [provider]  ibuprofen (ADVIL,MOTRIN) 600 MG tablet Take 1 tablet (600 mg total) by mouth every 6 (six) hours as needed. 03/02/15   Vanessa Kick, MD  levonorgestrel (MIRENA, 52 MG,) 20 MCG/24HR IUD  Mirena 20 mcg/24 hours (5 yrs) 52 mg intrauterine device  Take 1 device by intrauterine route.    [provider]  LORazepam (ATIVAN) 0.5 MG tablet Take 1-2 tablets (0.5-1 mg total) by mouth every 8 (eight) hours as needed for anxiety (or vertigo). 09/08/07   Delora Fuel, MD  meclizine (ANTIVERT) 25 MG tablet Take 1 tablet (25 mg total) by mouth 3 (three) times daily as needed for dizziness. 06/29/53   Delora Fuel, MD  metoCLOPramide (REGLAN) 10 MG tablet Take 1 tablet (10 mg total) by mouth every 6 (six) hours as needed for nausea (or vertigo). 7/32/20   Delora Fuel, MD  mupirocin nasal ointment (BACTROBAN) 2 % mupirocin 2 % topical ointment    [provider]  Prenatal Vit-Fe Fumarate-FA (PRENATAL MULTIVITAMIN) TABS tablet Take 1 tablet by mouth daily at 12 noon.     [provider]    Family History Family History  Problem Relation Age of Onset  . Suicidality Father     Social History Social History   Tobacco Use  . Smoking status: Never Smoker  . Smokeless tobacco: Never Used  Substance Use Topics  . Alcohol use: No    Alcohol/week: 0.0 standard drinks  . Drug use: No     Allergies   Other   Review of Systems Review of Systems  Constitutional: Positive for fatigue. Negative for activity change, appetite change, chills and fever.  HENT: Positive for congestion, rhinorrhea, sinus pressure and sore throat. Negative for ear pain and trouble swallowing.   Eyes: Negative for discharge and redness.  Respiratory: Positive for cough. Negative for chest tightness and shortness of breath.   Cardiovascular: Negative for chest pain.  Gastrointestinal: Positive for nausea. Negative for abdominal pain, diarrhea and vomiting.  Musculoskeletal: Negative for myalgias.  Skin: Negative for rash.  Neurological: Negative for dizziness, light-headedness and headaches.     Physical Exam Triage Vital Signs ED Triage Vitals  Enc Vitals Group     BP      Pulse      Resp      Temp      Temp src      SpO2      Weight      Height      Head Circumference      Peak Flow      Pain Score      Pain Loc      Pain Edu?      Excl. in [place]?    No data found.  Updated Vital Signs BP 102/67 (BP Location: Right Arm)   Pulse 63   Temp 97.9 F (36.6 C) (Oral)   Resp 18   SpO2 96%   Visual Acuity Right Eye Distance:   Left Eye Distance:   Bilateral Distance:    Right Eye Near:   Left Eye Near:    Bilateral Near:     Physical Exam Vitals and nursing note reviewed.  Constitutional:      Appearance: She is well-developed and well-nourished.     Comments: No acute distress  HENT:     Head: Normocephalic and atraumatic.     Ears:     Comments: Bilateral ears without tenderness to palpation of external auricle, tragus and mastoid,  EAC's without erythema or swelling, TM's with good bony landmarks and cone of light. Non erythematous.     Nose: Nose normal.     Mouth/Throat:     Comments: Oral mucosa pink and moist, no  tonsillar enlargement or exudate. Posterior pharynx patent and nonerythematous, no uvula deviation or swelling. Normal phonation. Eyes:     Conjunctiva/sclera: Conjunctivae normal.  Cardiovascular:     Rate and Rhythm: Normal rate.  Pulmonary:     Effort: Pulmonary effort is normal. No respiratory distress.     Comments: Breathing comfortably at rest, CTABL, no wheezing, rales or other adventitious sounds auscultated Abdominal:     General: There is no distension.  Musculoskeletal:        General: Normal range of motion.     Cervical back: Neck supple.  Skin:    General: Skin is warm and dry.  Neurological:     Mental Status: She is alert and oriented to person, place, and time.  Psychiatric:        Mood and Affect: Mood and affect normal.      UC Treatments / Results  Labs (all labs ordered are listed, but only abnormal results are displayed) Labs Reviewed - No data to display  EKG   Radiology DG Chest 2 View  Result Date: 04/01/2020 CLINICAL DATA:  Productive cough EXAM: CHEST - 2 VIEW COMPARISON:  Radiograph 06/01/2014 FINDINGS: Normal mediastinum and cardiac silhouette. Normal pulmonary vasculature. No evidence of effusion, infiltrate, or pneumothorax. No acute bony abnormality. Postcholecystectomy Calcified nodule in the RIGHT upper lobe unchanged from 2016. IMPRESSION: No active cardiopulmonary disease. Electronically Signed   By: Suzy Bouchard M.D.   On: 04/01/2020 13:41    Procedures Procedures (including critical care time)  Medications Ordered in UC Medications - No data to display  Initial Impression / Assessment and Plan / UC Course  I have reviewed the triage vital signs and the nursing notes.  Pertinent labs & imaging results that were available during my care of  the patient were reviewed by me and considered in my medical decision making (see chart for details).     Chest x-ray negative for pneumonia, recommending to continue symptomatic and supportive care of cough and congestion and symptoms less than 1 week, prior tests negative for Covid.  Did provide prescription for azithromycin to fill in an additional 3 to 4 days if still not having any improvement.   Discussed strict return precautions. Patient verbalized understanding and is agreeable with plan.  Final Clinical Impressions(s) / UC Diagnoses   Final diagnoses:  Viral URI with cough     Discharge Instructions     Continue with over the counter meds for congestion and cough Tessalon for cough every 8 hours Azithromycin on Monday if no improvement with an additional 3-4 days Rest and fluids    ED Prescriptions    Medication Sig Dispense Auth. Provider   azithromycin (ZITHROMAX) 250 MG tablet Take 1 tablet (250 mg total) by mouth daily. Take first 2 tablets together, then 1 every day until finished. 6 tablet Rehanna Oloughlin C, PA-C   benzonatate (TESSALON) 200 MG capsule Take 1 capsule (200 mg total) by mouth 3 (three) times daily as needed for up to 7 days for cough. 28 capsule Maclane Holloran, [place] C, PA-C     PDMP not reviewed this encounter.   Patra Gherardi, [place] C, PA-C 04/01/20 1350

## 2020-04-01 NOTE — ED Triage Notes (Signed)
Pt sts nasal congestion and productive cough x 4 days worse today; pt sts negative covid test x 2; pt sts feels like fever at times

## 2020-04-01 NOTE — Discharge Instructions (Signed)
Continue with over the counter meds for congestion and cough Tessalon for cough every 8 hours Azithromycin on Monday if no improvement with an additional 3-4 days Rest and fluids

## 2020-09-26 ENCOUNTER — Ambulatory Visit: Payer: Self-pay | Admitting: Neurology

## 2020-10-03 ENCOUNTER — Ambulatory Visit: Payer: BLUE CROSS/BLUE SHIELD | Admitting: Neurology

## 2020-10-24 ENCOUNTER — Encounter: Payer: Self-pay | Admitting: Neurology

## 2020-10-24 ENCOUNTER — Ambulatory Visit: Payer: BC Managed Care – PPO | Admitting: Neurology

## 2020-10-24 VITALS — BP 101/71 | HR 72 | Ht 64.0 in | Wt 141.0 lb

## 2020-10-24 DIAGNOSIS — G43709 Chronic migraine without aura, not intractable, without status migrainosus: Secondary | ICD-10-CM

## 2020-10-24 DIAGNOSIS — G43109 Migraine with aura, not intractable, without status migrainosus: Secondary | ICD-10-CM | POA: Insufficient documentation

## 2020-10-24 MED ORDER — UBRELVY 100 MG PO TABS: 100.0000 mg | ORAL_TABLET | ORAL | 0 refills | Status: DC | PRN

## 2020-10-24 MED ORDER — NURTEC 75 MG PO TBDP: 75.0000 mg | ORAL_TABLET | Freq: Every day | ORAL | 6 refills | Status: DC | PRN

## 2020-10-24 MED ORDER — AJOVY 225 MG/1.5ML ~~LOC~~ SOAJ: 225.0000 mg | SUBCUTANEOUS | 0 refills | Status: DC

## 2020-10-24 MED ORDER — PROPRANOLOL HCL ER 60 MG PO CP24: 60.0000 mg | ORAL_CAPSULE | Freq: Every day | ORAL | 4 refills | Status: DC

## 2020-10-24 MED ORDER — ONDANSETRON 4 MG PO TBDP: 4.0000 mg | ORAL_TABLET | Freq: Three times a day (TID) | ORAL | 3 refills | Status: DC | PRN

## 2020-10-24 NOTE — Patient Instructions (Addendum)
- Increase propranolol to 60mg  ER qhs for migraine prevention and essential tremor - Prevention: Ajovy monthly, gave samples she will give [place] feedback - Acute Management: Gave Nurtec and ubrelvy samples, she will give [place] feedback - Ondansetron: for nausea/motion sickness and can take with acute migraine medication  Ubrogepant tablets What is this medication? UBROGEPANT (ue BROE je pant) is used to treat migraine headaches with or without aura. An aura is a strange feeling or visual disturbance that warns youof an attack. It is not used to prevent migraines. This medicine may be used for other purposes; ask your health care provider orpharmacist if you have questions. COMMON BRAND NAME(S): Roselyn Meier What should I tell my care team before I take this medication? They need to know if you have any of these conditions: kidney disease liver disease an unusual or allergic reaction to ubrogepant, other medicines, foods, dyes, or preservatives pregnant or trying to get pregnant breast-feeding How should I use this medication? Take this medicine by mouth with a glass of water. Follow the directions on the prescription label. You can take it with or without food. If it upsets your stomach, take it with food. Take your medicine at regular intervals. Do not take it more often than directed. Do not stop taking except on your doctor'sadvice. Talk to your pediatrician about the use of this medicine in children. Specialcare may be needed. Overdosage: If you think you have taken too much of this medicine contact apoison control center or emergency room at once. NOTE: This medicine is only for you. Do not share this medicine with others. What if I miss a dose? This does not apply. This medicine is not for regular use. What may interact with this medication? Do not take this medicine with any of the following medicines: ceritinib certain antibiotics like chloramphenicol, clarithromycin, telithromycin certain  antivirals for HIV like atazanavir, cobicistat, darunavir, delavirdine, fosamprenavir, indinavir, ritonavir certain medicines for fungal infections like itraconazole, ketoconazole, posaconazole, voriconazole conivaptan grapefruit idelalisib mifepristone nefazodone ribociclib This medicine may also interact with the following medications: carvedilol certain medicines for seizures like phenobarbital, phenytoin ciprofloxacin cyclosporine eltrombopag fluconazole fluvoxamine quinidine rifampin [facility]. John's wort verapamil This list may not describe all possible interactions. Give your health care provider a list of all the medicines, herbs, non-prescription drugs, or dietary supplements you use. Also tell them if you smoke, drink alcohol, or use illegaldrugs. Some items may interact with your medicine. What should I watch for while using this medication? Visit your health care professional for regular checks on your progress. Tell your health care professional if your symptoms do not start to get better or ifthey get worse. Your mouth may get dry. Chewing sugarless gum or sucking hard candy and drinking plenty of water may help. Contact your health care professional if theproblem does not go away or is severe. What side effects may I notice from receiving this medication? Side effects that you should report to your doctor or health care professionalas soon as possible: allergic reactions like skin rash, itching or hives; swelling of the face, lips, or tongue Side effects that usually do not require medical attention (report these toyour doctor or health care professional if they continue or are bothersome): drowsiness dry mouth nausea tiredness This list may not describe all possible side effects. Call your doctor for medical advice about side effects. You may report side effects to FDA at1-800-FDA-1088. Where should I keep my medication? Keep out of the reach of children. Store at room  temperature between 15 and 30 degrees C (59 and 86 degrees F). Throw away any unused medicine after theexpiration date. NOTE: This sheet is a summary. It may not cover all possible information. If you have questions about this medicine, talk to your doctor, pharmacist, orhealth care provider.  2022 Elsevier/Gold Standard (2018-06-12 08:50:55) Rimegepant oral dissolving tablet What is this medication? RIMEGEPANT (ri ME je pant) is used to treat migraine headaches with or without aura. An aura is a strange feeling or visual disturbance that warns you of anattack. It is also used to prevent migraine headaches. This medicine may be used for other purposes; ask your health care provider orpharmacist if you have questions. COMMON BRAND NAME(S): NURTEC ODT What should I tell my care team before I take this medication? They need to know if you have any of these conditions: kidney disease liver disease an unusual or allergic reaction to rimegepant, other medicines, foods, dyes, or preservatives pregnant or trying to get pregnant breast-feeding How should I use this medication? Take the medicine by mouth. Follow the directions on the prescription label. Leave the tablet in the sealed blister pack until you are ready to take it. With dry hands, open the blister and gently remove the tablet. If the tablet breaks or crumbles, throw it away and take a new tablet out of the blister pack. Place the tablet in the mouth and allow it to dissolve, and then swallow. Do not cut, crush, or chew this medicine. You do not need water to take thismedicine. Talk to your pediatrician about the use of this medicine in children. Specialcare may be needed. Overdosage: If you think you have taken too much of this medicine contact apoison control center or emergency room at once. NOTE: This medicine is only for you. Do not share this medicine with others. What if I miss a dose? This does not apply. This medicine is not for  regular use. What may interact with this medication? This medicine may interact with the following medications: certain medicines for fungal infections like fluconazole, itraconazole rifampin This list may not describe all possible interactions. Give your health care provider a list of all the medicines, herbs, non-prescription drugs, or dietary supplements you use. Also tell them if you smoke, drink alcohol, or use illegaldrugs. Some items may interact with your medicine. What should I watch for while using this medication? Visit your health care professional for regular checks on your progress. Tell your health care professional if your symptoms do not start to get better or ifthey get worse. What side effects may I notice from receiving this medication? Side effects that you should report to your doctor or health care professionalas soon as possible: allergic reactions like skin rash, itching or hives; swelling of the face, lips, or tongue Side effects that usually do not require medical attention (report these toyour doctor or health care professional if they continue or are bothersome): nausea This list may not describe all possible side effects. Call your doctor for medical advice about side effects. You may report side effects to FDA at1-800-FDA-1088. Where should I keep my medication? Keep out of the reach of children and pets. Store at room temperature between 20 and 25 degrees C (68 and 77 degrees F).Get rid of any unused medicine after the expiration date. To get rid of medicines that are no longer needed or have expired: Take the medicine to a medicine take-back program. Check with your pharmacy or law enforcement to find a location. If  you cannot return the medicine, check the label or package insert to see if the medicine should be thrown out in the garbage or flushed down the toilet. If you are not sure, ask your health care provider. If it is safe to put it in the trash, take the  medicine out of the container. Mix the medicine with cat litter, dirt, coffee grounds, or other unwanted substance. Seal the mixture in a bag or container. Put it in the trash. NOTE: This sheet is a summary. It may not cover all possible information. If you have questions about this medicine, talk to your doctor, pharmacist, orhealth care provider.  2022 Elsevier/Gold Standard (2019-09-08 17:56:55) Ondansetron Dissolving Tablets What is this medication? ONDANSETRON (on DAN se tron) prevents nausea and vomiting from chemotherapy, radiation, or surgery. It works by blocking substances in the body that may cause nausea or vomiting. It belongs to a group of medications calledantiemetics. This medicine may be used for other purposes; ask your health care provider orpharmacist if you have questions. COMMON BRAND NAME(S): Zofran ODT What should I tell my care team before I take this medication? They need to know if you have any of these conditions: Heart disease History of irregular heartbeat Liver disease Low levels of magnesium or potassium in the blood An unusual or allergic reaction to ondansetron, granisetron, other medications, foods, dyes, or preservatives Pregnant or trying to get pregnant Breast-feeding How should I use this medication? These tablets are made to dissolve in the mouth. Do not try to push the tablet through the foil backing. With dry hands, peel away the foil backing and gently remove the tablet. Place the tablet in the mouth and allow it to dissolve, then swallow. While you may take these tablets with water, it is not necessary to doso. Talk to your care team regarding the use of this medication in children.Special care may be needed. Overdosage: If you think you have taken too much of this medicine contact apoison control center or emergency room at once. NOTE: This medicine is only for you. Do not share this medicine with others. What if I miss a dose? If you miss a dose,  take it as soon as you can. If it is almost time for yournext dose, take only that dose. Do not take double or extra doses. What may interact with this medication? Do not take this medication with any of the following: Apomorphine Certain medications for fungal infections like fluconazole, itraconazole, ketoconazole, posaconazole, voriconazole Cisapride Dronedarone Pimozide Thioridazine This medication may also interact with the following: Carbamazepine Certain medications for depression, anxiety, or psychotic disturbances Fentanyl Linezolid MAOIs like Carbex, Eldepryl, Marplan, Nardil, and Parnate Methylene blue (injected into a vein) Other medications that prolong the QT interval (cause an abnormal heart rhythm) like dofetilide, ziprasidone Phenytoin Rifampicin Tramadol This list may not describe all possible interactions. Give your health care provider a list of all the medicines, herbs, non-prescription drugs, or dietary supplements you use. Also tell them if you smoke, drink alcohol, or use illegaldrugs. Some items may interact with your medicine. What should I watch for while using this medication? Check with your care team as soon as you can if you have any sign of anallergic reaction. What side effects may I notice from receiving this medication? Side effects that you should report to your care team as soon as possible: Allergic reactions-skin rash, itching, hives, swelling of the face, lips, tongue, or throat Bowel blockage-stomach cramping, unable to have a bowel movement  or pass gas, loss of appetite, vomiting Chest pain (angina)-pain, pressure, or tightness in the chest, neck, back, or arms Heart rhythm changes-fast or irregular heartbeat, dizziness, feeling faint or lightheaded, chest pain, trouble breathing Irritability, confusion, fast or irregular heartbeat, muscle stiffness, twitching muscles, sweating, high fever, seizure, chills, vomiting, diarrhea, which may be signs  of serotonin syndrome Side effects that usually do not require medical attention (report to your careteam if they continue or are bothersome): Constipation Diarrhea General discomfort and fatigue Headache This list may not describe all possible side effects. Call your doctor for medical advice about side effects. You may report side effects to FDA at1-800-FDA-1088. Where should I keep my medication? Keep out of the reach of children and pets. Store between 2 and 30 degrees C (36 and 86 degrees F). Throw away any unusedmedication after the expiration date. NOTE: This sheet is a summary. It may not cover all possible information. If you have questions about this medicine, talk to your doctor, pharmacist, orhealth care provider.  2022 Elsevier/Gold Standard (2020-04-15 14:39:19) Rolanda Lundborg injection What is this medication? FREMANEZUMAB (fre ma NEZ ue mab) is used to prevent migraine headaches. This medicine may be used for other purposes; ask your health care provider orpharmacist if you have questions. COMMON BRAND NAME(S): AJOVY What should I tell my care team before I take this medication? They need to know if you have any of these conditions: an unusual or allergic reaction to fremanezumab, other medicines, foods, dyes, or preservatives pregnant or trying to get pregnant breast-feeding How should I use this medication? This medicine is for injection under the skin. You will be taught how to prepare and give this medicine. Use exactly as directed. Take your medicine atregular intervals. Do not take your medicine more often than directed. It is important that you put your used needles and syringes in a special sharps container. Do not put them in a trash can. If you do not have a sharpscontainer, call your pharmacist or healthcare provider to get one. Talk to your pediatrician regarding the use of this medicine in children.Special care may be needed. Overdosage: If you think you have  taken too much of this medicine contact apoison control center or emergency room at once. NOTE: This medicine is only for you. Do not share this medicine with others. What if I miss a dose? If you miss a dose, take it as soon as you can. If it is almost time for yournext dose, take only that dose. Do not take double or extra doses. What may interact with this medication? Interactions are not expected. This list may not describe all possible interactions. Give your health care provider a list of all the medicines, herbs, non-prescription drugs, or dietary supplements you use. Also tell them if you smoke, drink alcohol, or use illegaldrugs. Some items may interact with your medicine. What should I watch for while using this medication? Tell your doctor or healthcare professional if your symptoms do not start toget better or if they get worse. What side effects may I notice from receiving this medication? Side effects that you should report to your doctor or health care professionalas soon as possible: allergic reactions like skin rash, itching or hives, swelling of the face, lips, or tongue Side effects that usually do not require medical attention (report these toyour doctor or health care professional if they continue or are bothersome): pain, redness, or irritation at site where injected This list may not describe all possible side effects.  Call your doctor for medical advice about side effects. You may report side effects to FDA at1-800-FDA-1088. Where should I keep my medication? Keep out of the reach of children. You will be instructed on how to store this medicine. Throw away any unusedmedicine after the expiration date on the label. NOTE: This sheet is a summary. It may not cover all possible information. If you have questions about this medicine, talk to your doctor, pharmacist, orhealth care provider.  2022 Elsevier/Gold Standard (2016-12-24 17:22:56)

## 2020-10-24 NOTE — Progress Notes (Addendum)
[facility]    Provider:  Dr Jaynee Eagles Requesting Provider:  Izora Gala, MD Primary Care Provider:  Shon Baton, MD  CC:  Migraines  HPI:  Carol Mckenzie is a 50 y.o. female here as requested by Vanessa Kick, MD for migraine.  Past medical history antiphospholipid antibody syndrome complicating pregnancy, ADHD, depression, essential tremor, hearing loss, persistent hypersomnia, migraine.  I reviewed Dr. Janeice Robinson notes, [facility]NT [facility]: Patient self-referred for vertigo, patient had an attack of vertigo, it did seem to be exacerbated by changing positions especially turning over towards the side, she had a similar episode several months prior, that lasted a few days also, she felt some pressure in her left ear and some tinnitus, hearing changes as well she is never had a hearing test, she does have a history of visual migraines and some headaches associated with these recent events.  Examination was normal.  She was diagnosed with BPPV, doing the Epley maneuvers seem to be helping.  Also diagnosed with vestibular migraine.  She had an audiometric evaluation and continues to follow.  Migraines Started at the age of 88, she has tried many medications over the years, she had severe visual auras, severe vomiting, sawa neurologist at [facility], she had topiramate and fiorinal, she went on continuous BC pills, that helped. She has mirena but she started getting periods again and started getting.Migraines start unilateral, usually right sided, throbbing, pulsating, pounding with nausea and vomiting and are moderate to severe, photophobia and phonophobia and can last 24 hours.  she has an aura flashing lights that grows bigger, recently has not had the auras, She had 2 episodes of severe vertigo.  She clenches and have a bit guard. Headaches every day. Pulsating/pounding/throbbing, light and sound sensitivity, 15 migraine days a month, no medication overuse, she sleeps well, she  exercises, watches her diet and has kep migraine diaries to watch for food or environmental triggers. No other focal neurologic deficits, associated symptoms, inciting events or modifiable factors.   Reviewed notes, labs and imaging from outside physicians, which showed:  MRI brain: Normal. No acute intracranial abnormalities including mass lesion or mass effect, hydrocephalus, extra-axial fluid collection, midline shift, hemorrhage, or acute infarction, large ischemic events (personally reviewed images)  From a thorough review of notes and history(migraines since the age of 66), medications tried that can be used in migraine management include: Tylenol, aspirin, Stadol, Benadryl, Prozac, ibuprofen, hydrocodone, Toradol, meclizine, Reglan, Zofran, oxycodone, propranolol, nurtec, imitrex(chest pain), zomig(chest pain), maxalt(chest pain),Topiramate(side effects could not tolerate), Amitriptyline/nortriptyline(sedation side effects cannot tolerate), on metoprolol so carvedilol is contraindicated, tried depakote (worsened tremor), emgality (ineffective), Aimovig (contraindicated due to constipation), metoprolol, verapamil, amlodipine  Review of Systems: Patient complains of symptoms per HPI as well as the following symptoms headache,vertigo. Pertinent negatives and positives per HPI. All others negative.   Social History   Socioeconomic History   Marital status: Married    Spouse name: Vonna Kotyk   Number of children: 2   Years of education: MD   Highest education level: Not on file  Occupational History    Employer: OTHER    Comment: Dermatology Specialist  Tobacco Use   Smoking status: Never   Smokeless tobacco: Never  Substance and Sexual Activity   Alcohol use: Yes    Alcohol/week: 1.0 standard drink    Types: 1 Standard drinks or equivalent per week   Drug use: No   Sexual activity: Yes    Birth control/protection: I.U.D.  Other Topics Concern  Not on file  Social History Narrative    Patient lives at home with family.   Caffeine use: 1 cup daily   Social Determinants of Health   Financial Resource Strain: Not on file  Food Insecurity: Not on file  Transportation Needs: Not on file  Physical Activity: Not on file  Stress: Not on file  Social Connections: Not on file  Intimate Partner Violence: Not on file    Family History  Problem Relation Age of Onset   Migraines Mother    Suicidality Father    Migraines Maternal Uncle     Past Medical History:  Diagnosis Date   Antiphospholipid antibody syndrome ([place]) 12/21/2013   Recurrent miscarriage; no thrombotic events   Depression    Migraine    Mitral valve prolapse 12/21/2013   MVP (mitral valve prolapse)    Does not get pre-treated w/abx - no problem per pt   PONV (postoperative nausea and vomiting)     Patient Active Problem List   Diagnosis Date Noted   Chronic migraine without aura without status migrainosus, not intractable 10/24/2020   Amenorrhea 08/26/2017   Nausea 08/26/2017   Antiphospholipid antibody syndrome complicating pregnancy ([place]) 03/01/2015   Spontaneous vaginal delivery 03/01/2015   Antiphospholipid antibody syndrome ([place]) 12/21/2013   Mitral valve prolapse 12/21/2013   Abdominal pregnancy with intrauterine pregnancy 12/21/2013   KNEE PAIN, RIGHT 05/10/2008   BUNIONETTE 05/10/2008   FOOT PAIN, RIGHT 05/10/2008   HYPERSOMNIA, PERSISTENT 07/14/2007    Past Surgical History:  Procedure Laterality Date   ABDOMINOPLASTY     CAROTID ENDARTERECTOMY     CESAREAN SECTION     CHOLECYSTECTOMY     DILATION AND EVACUATION  05/09/2012   Procedure: DILATATION AND EVACUATION;  Surgeon: Farrel Gobble. Harrington Challenger, MD;  Location: [facility];  Service: Gynecology;  Laterality: N/A;   RHINOPLASTY     VENTRICULOPERITONEAL SHUNT     WISDOM TOOTH EXTRACTION      Current Outpatient Medications  Medication Sig Dispense Refill   amphetamine-dextroamphetamine (ADDERALL XR) 20 MG 24 hr capsule Take 20 mg by mouth  daily.     finasteride (PROSCAR) 5 MG tablet Take 5 mg by mouth daily.     FLUoxetine (PROZAC) 20 MG capsule Take 20 mg by mouth daily.     Fremanezumab-vfrm (AJOVY) 225 MG/1.5ML SOAJ Inject 225 mg into the skin every 30 (thirty) days. 9 mL 0   ibuprofen (ADVIL,MOTRIN) 600 MG tablet Take 1 tablet (600 mg total) by mouth every 6 (six) hours as needed. 90 tablet 0   levonorgestrel (MIRENA) 20 MCG/24HR IUD Mirena 20 mcg/24 hours (5 yrs) 52 mg intrauterine device  Take 1 device by intrauterine route.     ondansetron (ZOFRAN-ODT) 4 MG disintegrating tablet Take 1-2 tablets (4-8 mg total) by mouth every 8 (eight) hours as needed. For migraine, nausea or motion sickness 30 tablet 3   propranolol (INDERAL) 20 MG tablet propranolol 20 mg tablet     propranolol ER (INDERAL LA) 60 MG 24 hr capsule Take 1 capsule (60 mg total) by mouth daily. 90 capsule 4   Rimegepant Sulfate (NURTEC) 75 MG TBDP Take 75 mg by mouth daily as needed. For migraines. Take as close to onset of migraine as possible. One daily maximum. 10 tablet 6   spironolactone (ALDACTONE) 50 MG tablet spironolactone 50 mg tablet     Ubrogepant (UBRELVY) 100 MG TABS Take 100 mg by mouth every 2 (two) hours as needed. Maximum 200mg  a day. 10 tablet 0  No current facility-administered medications for this visit.    Allergies as of 10/24/2020 - Review Complete 10/24/2020  Allergen Reaction Noted   Other Hives 05/06/2012    Vitals: BP 101/71   Pulse 72   Ht 5\' 4"  (1.626 m)   Wt 141 lb (64 kg)   BMI 24.20 kg/m  Last Weight:  Wt Readings from Last 1 Encounters:  10/24/20 141 lb (64 kg)   Last Height:   Ht Readings from Last 1 Encounters:  10/24/20 5\' 4"  (1.626 m)     Physical exam: Exam: Gen: NAD, conversant, well nourised, well groomed                     CV: RRR, no MRG. No Carotid Bruits. No peripheral edema, warm, nontender Eyes: Conjunctivae clear without exudates or hemorrhage  Neuro: Detailed Neurologic  Exam  Speech:    Speech is normal; fluent and spontaneous with normal comprehension.  Cognition:    The patient is oriented to person, place, and time;     recent and remote memory intact;     language fluent;     normal attention, concentration,     fund of knowledge Cranial Nerves:    The pupils are equal, round, and reactive to light. The fundi are normal and spontaneous venous pulsations are present. Visual fields are full to finger confrontation. Extraocular movements are intact. Trigeminal sensation is intact and the muscles of mastication are normal. The face is symmetric. The palate elevates in the midline. Hearing intact. Voice is normal. Shoulder shrug is normal. The tongue has normal motion without fasciculations.   Coordination:    Normal   Gait:    normal.   Motor Observation:    No asymmetry, no atrophy, and no involuntary movements noted. Tone:    Normal muscle tone.    Posture:    Posture is normal. normal erect    Strength:    Strength is V/V in the upper and lower limbs.      Sensation: intact to LT     Reflex Exam:  DTR's:    Deep tendon reflexes in the upper and lower extremities are normal bilaterally.   Toes:    The toes are downgoing bilaterally.   Clonus:    Clonus is absent.    Assessment/Plan:  Patient with chronic migraines failed multiple medications daily headaches and 15 mod to severe migraine days a month for > 2 year  Increase propranolol to 60mg  ER qhs for migraine prevention and essential tremor (switched to metoprolol) Prevention: Ajovy monthly, gave samples she will give [place] feedback (Addendum, loves it, will prescribe) Acute Management: Gave Nurtec and ubrelvy samples, she will give [place] feedback Ondansetron: for nausea/motion sickness and can take with acute migraine medication  BPPV: Sound like her vertigo was bppv but vertigo can be associated with migraines, discussed  Cannot tolerate triptans, chest pain and tightness, triptans  contraindicated.   Discussed: There is increased risk for stroke in women with migraine with aura and a contraindication for the combined contraceptive pill for use by women who have migraine with aura. The risk for women with migraine without aura is lower. However other risk factors like smoking are far more likely to increase stroke risk than migraine. There is a recommendation for no smoking and for the use of OCPs without estrogen such as progestogen only pills particularly for women with migraine with aura.Marland Kitchen People who have migraine headaches with auras may be 3 times more  likely to have a stroke caused by a blood clot, compared to migraine patients who don't see auras. Women who take hormone-replacement therapy may be 30 percent more likely to suffer a clot-based stroke than women not taking medication containing estrogen. Other risk factors like smoking and high blood pressure may be  much more important.   No orders of the defined types were placed in this encounter.  Meds ordered this encounter  Medications   propranolol ER (INDERAL LA) 60 MG 24 hr capsule    Sig: Take 1 capsule (60 mg total) by mouth daily.    Dispense:  90 capsule    Refill:  4   ondansetron (ZOFRAN-ODT) 4 MG disintegrating tablet    Sig: Take 1-2 tablets (4-8 mg total) by mouth every 8 (eight) hours as needed. For migraine, nausea or motion sickness    Dispense:  30 tablet    Refill:  3   Fremanezumab-vfrm (AJOVY) 225 MG/1.5ML SOAJ    Sig: Inject 225 mg into the skin every 30 (thirty) days.    Dispense:  9 mL    Refill:  0    Patient has copay card; she can have medication regardless of insurance approval or copay amount.   Ubrogepant (UBRELVY) 100 MG TABS    Sig: Take 100 mg by mouth every 2 (two) hours as needed. Maximum 200mg  a day.    Dispense:  10 tablet    Refill:  0   Rimegepant Sulfate (NURTEC) 75 MG TBDP    Sig: Take 75 mg by mouth daily as needed. For migraines. Take as close to onset of migraine as  possible. One daily maximum.    Dispense:  10 tablet    Refill:  6     Cc: Vanessa Kick, MD,  Shon Baton, MD  Sarina Ill, MD  [facility] [address] [place], [place] [address]  Phone [phone] Fax [phone]

## 2020-12-07 ENCOUNTER — Other Ambulatory Visit: Payer: Self-pay | Admitting: Internal Medicine

## 2020-12-07 DIAGNOSIS — Z8249 Family history of ischemic heart disease and other diseases of the circulatory system: Secondary | ICD-10-CM

## 2020-12-08 ENCOUNTER — Other Ambulatory Visit: Payer: Self-pay | Admitting: Dermatology

## 2020-12-08 DIAGNOSIS — R194 Change in bowel habit: Secondary | ICD-10-CM

## 2020-12-09 ENCOUNTER — Other Ambulatory Visit: Payer: Self-pay | Admitting: Internal Medicine

## 2020-12-09 DIAGNOSIS — R194 Change in bowel habit: Secondary | ICD-10-CM

## 2020-12-21 ENCOUNTER — Telehealth: Payer: Self-pay | Admitting: Plastic Surgery

## 2020-12-21 ENCOUNTER — Other Ambulatory Visit (HOSPITAL_COMMUNITY): Payer: Self-pay

## 2020-12-21 ENCOUNTER — Encounter: Payer: Self-pay | Admitting: Plastic Surgery

## 2020-12-21 MED ORDER — LIDOCAINE 23% - TETRACAINE 7% TOPICAL OINTMENT (PLASTICIZED)
1.0000 "application " | TOPICAL_OINTMENT | Freq: Once | CUTANEOUS | 0 refills | Status: AC
  Filled 2020-12-21: qty 60, 1d supply, fill #0

## 2020-12-21 NOTE — Telephone Encounter (Signed)
Plan for Halo laser.

## 2020-12-26 ENCOUNTER — Other Ambulatory Visit: Payer: Self-pay

## 2020-12-26 ENCOUNTER — Other Ambulatory Visit: Payer: Self-pay | Admitting: Internal Medicine

## 2020-12-26 ENCOUNTER — Encounter: Payer: Self-pay | Admitting: Plastic Surgery

## 2020-12-26 ENCOUNTER — Ambulatory Visit
Admission: RE | Admit: 2020-12-26 | Discharge: 2020-12-26 | Disposition: A | Discharge status: Home / Self Care | Payer: BC Managed Care – PPO | Source: Ambulatory Visit | Attending: Internal Medicine | Admitting: Internal Medicine

## 2020-12-26 ENCOUNTER — Ambulatory Visit (INDEPENDENT_AMBULATORY_CARE_PROVIDER_SITE_OTHER): Payer: Self-pay | Admitting: Plastic Surgery

## 2020-12-26 DIAGNOSIS — Z719 Counseling, unspecified: Secondary | ICD-10-CM

## 2020-12-26 DIAGNOSIS — R194 Change in bowel habit: Secondary | ICD-10-CM

## 2020-12-26 NOTE — Progress Notes (Signed)
HALO Treatment         Zone  Area (cm2) Target Energy (Joules) Delivered Energy (J) 1470 Depth (Micron) 1470 Density (%) 2940 Depth (M) 2940 Density (%)  1 108 1035 1076 400     2 108 1035 1092 400     3 55 [phone]     4 55 615 664 425     5 23 225 272 400       Perioral 73 [phone]     Rt Neck 23 [phone]     Lt Neck 23 [phone]

## 2021-01-06 ENCOUNTER — Ambulatory Visit (INDEPENDENT_AMBULATORY_CARE_PROVIDER_SITE_OTHER): Payer: BC Managed Care – PPO | Admitting: Plastic Surgery

## 2021-01-06 ENCOUNTER — Other Ambulatory Visit: Payer: Self-pay

## 2021-01-06 ENCOUNTER — Encounter: Payer: Self-pay | Admitting: Plastic Surgery

## 2021-01-06 VITALS — BP 106/70 | HR 59 | Ht 64.0 in | Wt 140.0 lb

## 2021-01-06 DIAGNOSIS — Z719 Counseling, unspecified: Secondary | ICD-10-CM

## 2021-01-06 NOTE — Progress Notes (Signed)
Patient ID: Carol Mckenzie, female    DOB: Aug 11, 1970, 50 y.o.   MRN: [id number]   Chief Complaint  Patient presents with   Advice Only    Mammary Hyperplasia: The patient is a 50 y.o. female with a history of mammary hyperplasia for several years.  She has  large breasts.  She breast feed several years ago.   Activities that are hindered by enlarged breasts include: exercise and running.  She has tried supportive clothing as well as fitted bras without improvement.  Her breasts are extremely large and fairly symmetric.  The sternal to nipple distance on the right is 24 cm and the left is 24 cm.  The IMF distance is 10 cm.  She is 5 feet 4 inches tall and weighs 140 pounds.  The BMI = 24.  Preoperative bra size = 34DD cup. She would like to be a C cup.  The estimated excess breast tissue to be removed at the time of surgery = 200 grams on the left and 200 grams on the right.  Mammogram history: 2022.  Family history of breast cancer:  no.  Tobacco use:  no.   The patient expresses the desire to pursue surgical intervention.    Review of Systems  Constitutional:  Negative for activity change and appetite change.  Eyes: Negative.   Respiratory: Negative.    Cardiovascular: Negative.  Negative for leg swelling.  Gastrointestinal: Negative.   Endocrine: Negative.   Genitourinary: Negative.   Musculoskeletal: Negative.   Skin: Negative.   Hematological: Negative.   Psychiatric/Behavioral: Negative.     Past Medical History:  Diagnosis Date   Antiphospholipid antibody syndrome ([facility]) 12/21/2013   Recurrent miscarriage; no thrombotic events   Depression    Migraine    Mitral valve prolapse 12/21/2013   MVP (mitral valve prolapse)    Does not get pre-treated w/abx - no problem per pt   PONV (postoperative nausea and vomiting)     Past Surgical History:  Procedure Laterality Date   ABDOMINOPLASTY     CAROTID ENDARTERECTOMY     CESAREAN SECTION     CHOLECYSTECTOMY     DILATION AND  EVACUATION  05/09/2012   Procedure: DILATATION AND EVACUATION;  Surgeon: Farrel Gobble. Harrington Challenger, MD;  Location: [facility];  Service: Gynecology;  Laterality: N/A;   RHINOPLASTY     VENTRICULOPERITONEAL SHUNT     WISDOM TOOTH EXTRACTION        Current Outpatient Medications:    amphetamine-dextroamphetamine (ADDERALL XR) 20 MG 24 hr capsule, Take 20 mg by mouth daily., Disp: , Rfl:    finasteride (PROSCAR) 5 MG tablet, Take 5 mg by mouth daily., Disp: , Rfl:    FLUoxetine (PROZAC) 20 MG capsule, Take 20 mg by mouth daily., Disp: , Rfl:    Fremanezumab-vfrm (AJOVY) 225 MG/1.5ML SOAJ, Inject 225 mg into the skin every 30 (thirty) days., Disp: 9 mL, Rfl: 0   ibuprofen (ADVIL,MOTRIN) 600 MG tablet, Take 1 tablet (600 mg total) by mouth every 6 (six) hours as needed., Disp: 90 tablet, Rfl: 0   levonorgestrel (MIRENA) 20 MCG/24HR IUD, Mirena 20 mcg/24 hours (5 yrs) 52 mg intrauterine device  Take 1 device by intrauterine route., Disp: , Rfl:    propranolol (INDERAL) 20 MG tablet, propranolol 20 mg tablet, Disp: , Rfl:    Rimegepant Sulfate (NURTEC) 75 MG TBDP, Take 75 mg by mouth daily as needed. For migraines. Take as close to onset of migraine as possible. One daily  maximum., Disp: 10 tablet, Rfl: 6   spironolactone (ALDACTONE) 50 MG tablet, spironolactone 50 mg tablet, Disp: , Rfl:    Objective:   Vitals:   01/06/21 1423  BP: 106/70  Pulse: (!) 59  SpO2: 100%    Physical Exam Vitals and nursing note reviewed.  Constitutional:      Appearance: Normal appearance.  HENT:     Head: Normocephalic and atraumatic.  Cardiovascular:     Rate and Rhythm: Normal rate.     Pulses: Normal pulses.  Pulmonary:     Effort: Pulmonary effort is normal. No respiratory distress.  Abdominal:     General: Abdomen is flat. There is no distension.     Tenderness: There is no abdominal tenderness.  Musculoskeletal:        General: No swelling or deformity.  Skin:    General: Skin is warm.     Capillary Refill:  Capillary refill takes less than 2 seconds.     Coloration: Skin is not jaundiced.     Findings: No bruising.  Neurological:     General: No focal deficit present.     Mental Status: She is alert and oriented to person, place, and time.  Psychiatric:        Mood and Affect: Mood normal.        Behavior: Behavior normal.        Thought Content: Thought content normal.        Judgment: Judgment normal.    Assessment & Plan:  Encounter for counseling  The patient is a good candidate for mastopexy / reduction with liposuction.   We discussed some of the risks and complication including change in NAC sensation, inability to breast feed in the future and changes with age and weight.  Pictures were obtained of the patient and placed in the chart with the patient's or guardian's permission.   [place], DO

## 2021-01-09 ENCOUNTER — Other Ambulatory Visit: Payer: BC Managed Care – PPO

## 2021-01-23 ENCOUNTER — Ambulatory Visit
Admission: RE | Admit: 2021-01-23 | Discharge: 2021-01-23 | Disposition: A | Discharge status: Home / Self Care | Payer: No Typology Code available for payment source | Source: Ambulatory Visit | Attending: Internal Medicine | Admitting: Internal Medicine

## 2021-01-23 ENCOUNTER — Other Ambulatory Visit: Payer: Self-pay

## 2021-01-23 DIAGNOSIS — Z8249 Family history of ischemic heart disease and other diseases of the circulatory system: Secondary | ICD-10-CM

## 2021-02-28 NOTE — Progress Notes (Signed)
Patient ID: Carol Mckenzie, female    DOB: [birth year], 50 y.o.   MRN: [id number]  Chief Complaint  Patient presents with   Pre-op Exam      ICD-10-CM   1. Encounter for counseling  Z71.9       History of Present Illness: Carol Mckenzie is a 50 y.o.  female  with a history of macromastia.  She presents for preoperative evaluation for upcoming procedure, Bilateral Breast Reduction with liposuction, scheduled for 03/30/2021 with Dr.  Marla Roe  The patient has not had problems with anesthesia. No history of DVT/PE.  No family history of DVT/PE.  She has a history of antiphospholipid antibody syndrome.  Patient is not currently taking any blood thinners.  No history of CVA/MI.  She has a history of 5 miscarriages.  Summary of Previous Visit: Preoperative bra size is 34 double D cup.  She would like to be a C cup.   Estimated excess breast tissue to be removed at time of surgery: 200 grams  PMH Significant for: Antiphospholipid antibody syndrome, mitral valve prolapse  Patient is scheduled for mid urethral sling with cystoscopy on 03/28/2021 with urology at [facility].  She reports that if possible she would like the case is to be coordinated.  If unable to be coordinated she is going to plan to reschedule mastopexy/reduction.  She also reports that she is unsure how small she would like to be and would like to know the difference between a breast reduction and mastopexy.  Past Medical History: Allergies: Allergies  Allergen Reactions   Other Hives    Reaction to unknown narcotic post C-section    Current Medications:  Current Outpatient Medications:    amphetamine-dextroamphetamine (ADDERALL XR) 20 MG 24 hr capsule, Take 20 mg by mouth daily., Disp: , Rfl:    dutasteride (AVODART) 0.5 MG capsule, Take 0.5 mg by mouth daily., Disp: , Rfl:    FLUoxetine (PROZAC) 20 MG capsule, Take 20 mg by mouth daily., Disp: , Rfl:    Fremanezumab-vfrm (AJOVY) 225 MG/1.5ML SOAJ,  Inject 225 mg into the skin every 30 (thirty) days., Disp: 9 mL, Rfl: 0   levonorgestrel (MIRENA) 20 MCG/24HR IUD, Mirena 20 mcg/24 hours (5 yrs) 52 mg intrauterine device  Take 1 device by intrauterine route., Disp: , Rfl:    propranolol (INDERAL) 20 MG tablet, propranolol 20 mg tablet, Disp: , Rfl:    spironolactone (ALDACTONE) 50 MG tablet, spironolactone 50 mg tablet, Disp: , Rfl:   Past Medical Problems: Past Medical History:  Diagnosis Date   Antiphospholipid antibody syndrome ([place]) 12/21/2013   Recurrent miscarriage; 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 Harrington Challenger, MD;  Location: [facility];  Service: Gynecology;  Laterality: N/A;   RHINOPLASTY     VENTRICULOPERITONEAL SHUNT     WISDOM TOOTH EXTRACTION      Social History: Social History   Socioeconomic History   Marital status: Married    Spouse name: Carol Mckenzie  Number of children: 2   Years of education: MD   Highest education level: Not on file  Occupational History    Employer: OTHER    Comment: Dermatology Specialist  Tobacco Use   Smoking status: Never   Smokeless tobacco: Never  Substance and Sexual Activity   Alcohol use: Yes    Alcohol/week: 1.0 standard drink    Types: 1 Standard drinks or equivalent per week   Drug use: No   Sexual activity: Yes    Birth control/protection: I.U.D.  Other Topics Concern   Not on file  Social History Narrative   Patient lives at home with family.   Caffeine use: 1 cup daily   Social Determinants of Health   Financial Resource Strain: Not on  file  Food Insecurity: Not on file  Transportation Needs: Not on file  Physical Activity: Not on file  Stress: Not on file  Social Connections: Not on file  Intimate Partner Violence: Not on file    Family History: Family History  Problem Relation Age of Onset   Migraines Mother    Suicidality Father    Migraines Maternal Uncle     Review of Systems: Review of Systems  Constitutional: Negative.   Cardiovascular: Negative.   Gastrointestinal: Negative.    Physical Exam: Vital Signs BP 109/64 (BP Location: Left Arm, Patient Position: Sitting, Cuff Size: Normal)   Pulse (!) 59   Ht 5\' 4"  (1.626 m)   Wt 142 lb 9.6 oz (64.7 kg)   SpO2 99%   BMI 24.48 kg/m   Physical Exam Constitutional:      General: Not in acute distress.    Appearance: Normal appearance. Not ill-appearing.  HENT:     Head: Normocephalic and atraumatic.  Eyes:     Pupils: Pupils are equal, round Neck:     Musculoskeletal: Normal range of motion.  Cardiovascular:     Rate and Rhythm: Normal rate Pulmonary:     Effort: Pulmonary effort is normal. No respiratory distress.  Skin:    General: Skin is warm and dry.     Findings: No erythema or rash.  Neurological:     General: No focal deficit present.     Mental Status: Alert and oriented to person, place, and time. Mental status is at baseline.     Motor: No weakness.  Psychiatric:        Mood and Affect: Mood normal.        Behavior: Behavior normal.    Assessment/Plan: The patient is scheduled for mastopexy/bilateral breast reduction with liposuction with Dr. Marla Roe.  Risks, benefits, and alternatives of procedure discussed, questions answered and consent obtained.    Smoking Status: Non-smoker; Counseling Given?  N/A Last Mammogram: 2022; Results: Negative  Caprini Score: 8, high; Risk Factors include: Age, antiphospholipid antibody syndrome, greater than 3 miscarriages, Mirena IUD and length of planned surgery.  Recommendation per  guideline is 7 to 10 days of postoperative pharmacological prophylaxis.  Given patient's overall health, may be able to hold postoperative Lovenox and recommend mechanical prophylaxis intraoperatively and early ambulation.  Will discuss with Dr. Marla Roe.  Pictures obtained: @consult   Post-op Rx sent to pharmacy:  Zofran, Keflex.  Patient would prefer to avoid narcotics for postop pain control.    Patient was provided with the breast reduction and General Surgical Risk consent document and Pain Medication Agreement prior to their appointment.  They had adequate time to read through the risk consent documents and Pain Medication Agreement. We also discussed  them in person together during this preop appointment. All of their questions were answered to their satisfaction.  Recommended calling if they have any further questions.  Risk consent form and Pain Medication Agreement to be scanned into patient's chart.  The risk that can be encountered with breast reduction/mastopexy were discussed and include the following but not limited to these:  Breast asymmetry, fluid accumulation, firmness of the breast, inability to breast feed, loss of nipple or areola, skin loss, decrease or no nipple sensation, fat necrosis of the breast tissue, bleeding, infection, healing delay.  There are risks of anesthesia, changes to skin sensation and injury to nerves or blood vessels.  The muscle can be temporarily or permanently injured.  You may have an allergic reaction to tape, suture, glue, blood products which can result in skin discoloration, swelling, pain, skin lesions, poor healing.  Any of these can lead to the need for revisonal surgery or stage procedures.  A reduction has potential to interfere with diagnostic procedures.  Nipple or breast piercing can increase risks of infection.  This procedure is best done when the breast is fully developed.  Changes in the breast will continue to occur over time.  Pregnancy can  alter the outcomes of previous breast reduction surgery, weight gain and weigh loss can also effect the long term appearance.   The risks that can be encountered with and after liposuction were discussed and include the following but no limited to these:  Asymmetry, fluid accumulation, firmness of the area, fat necrosis with death of fat tissue, bleeding, infection, delayed healing, anesthesia risks, skin sensation changes, injury to structures including nerves, blood vessels, and muscles which may be temporary or permanent, allergies to tape, suture materials and glues, blood products, topical preparations or injected agents, skin and contour irregularities, skin discoloration and swelling, deep vein thrombosis, cardiac and pulmonary complications, pain, which may persist, persistent pain, recurrence of the lesion, poor healing of the incision, possible need for revisional surgery or staged procedures. Thiere can also be persistent swelling, poor wound healing, rippling or loose skin, worsening of cellulite, swelling, and thermal burn or heat injury from ultrasound with the ultrasound-assisted lipoplasty technique. Any change in weight fluctuations can alter the outcome.  Discussed with patient today that I would discuss a few of her questions with Dr. Marla Roe.  We discussed the possibility of coordinating case with urology to avoid anesthesia twice, possibility for minor abdominal scar revision and nipple reduction.    Electronically signed by: Carola Rhine Lazaro Isenhower, PA-C 03/01/2021 10:42 AM

## 2021-03-01 ENCOUNTER — Encounter: Payer: Self-pay | Admitting: Surgical

## 2021-03-01 ENCOUNTER — Other Ambulatory Visit: Payer: Self-pay

## 2021-03-01 ENCOUNTER — Ambulatory Visit (INDEPENDENT_AMBULATORY_CARE_PROVIDER_SITE_OTHER): Payer: BC Managed Care – PPO | Admitting: Surgical

## 2021-03-01 VITALS — BP 109/64 | HR 59 | Ht 64.0 in | Wt 142.6 lb

## 2021-03-01 DIAGNOSIS — Z719 Counseling, unspecified: Secondary | ICD-10-CM

## 2021-03-28 ENCOUNTER — Encounter (HOSPITAL_COMMUNITY): Payer: Self-pay

## 2021-03-28 ENCOUNTER — Other Ambulatory Visit: Payer: Self-pay

## 2021-03-28 ENCOUNTER — Observation Stay (HOSPITAL_COMMUNITY)
Admission: EM | Admit: 2021-03-28 | Discharge: 2021-03-29 | Disposition: A | Discharge status: Home / Self Care | Payer: BC Managed Care – PPO | Attending: Internal Medicine | Admitting: Internal Medicine

## 2021-03-28 DIAGNOSIS — R079 Chest pain, unspecified: Secondary | ICD-10-CM

## 2021-03-28 DIAGNOSIS — Z79899 Other long term (current) drug therapy: Secondary | ICD-10-CM | POA: Diagnosis not present

## 2021-03-28 DIAGNOSIS — I493 Ventricular premature depolarization: Secondary | ICD-10-CM

## 2021-03-28 DIAGNOSIS — I498 Other specified cardiac arrhythmias: Secondary | ICD-10-CM

## 2021-03-28 DIAGNOSIS — I428 Other cardiomyopathies: Secondary | ICD-10-CM | POA: Diagnosis not present

## 2021-03-28 DIAGNOSIS — Z20822 Contact with and (suspected) exposure to covid-19: Secondary | ICD-10-CM | POA: Diagnosis not present

## 2021-03-28 DIAGNOSIS — R7989 Other specified abnormal findings of blood chemistry: Secondary | ICD-10-CM | POA: Diagnosis present

## 2021-03-28 DIAGNOSIS — I5181 Takotsubo syndrome: Secondary | ICD-10-CM | POA: Diagnosis not present

## 2021-03-28 DIAGNOSIS — R008 Other abnormalities of heart beat: Secondary | ICD-10-CM | POA: Diagnosis not present

## 2021-03-28 DIAGNOSIS — R778 Other specified abnormalities of plasma proteins: Secondary | ICD-10-CM

## 2021-03-28 DIAGNOSIS — I959 Hypotension, unspecified: Secondary | ICD-10-CM | POA: Insufficient documentation

## 2021-03-28 DIAGNOSIS — B3322 Viral myocarditis: Secondary | ICD-10-CM

## 2021-03-28 DIAGNOSIS — R002 Palpitations: Secondary | ICD-10-CM

## 2021-03-28 LAB — CBC WITH DIFFERENTIAL/PLATELET
Abs Immature Granulocytes: 0.04 10*3/uL (ref 0.00–0.07)
Basophils Absolute: 0 10*3/uL (ref 0.0–0.1)
Basophils Relative: 0 %
Eosinophils Absolute: 0 10*3/uL (ref 0.0–0.5)
Eosinophils Relative: 0 %
HCT: 41.1 % (ref 36.0–46.0)
Hemoglobin: 13.8 g/dL (ref 12.0–15.0)
Immature Granulocytes: 0 %
Lymphocytes Relative: 6 %
Lymphs Abs: 0.6 10*3/uL — ABNORMAL LOW (ref 0.7–4.0)
MCH: 32.6 pg (ref 26.0–34.0)
MCHC: 33.6 g/dL (ref 30.0–36.0)
MCV: 97.2 fL (ref 80.0–100.0)
Monocytes Absolute: 0.2 10*3/uL (ref 0.1–1.0)
Monocytes Relative: 2 %
Neutro Abs: 9.2 10*3/uL — ABNORMAL HIGH (ref 1.7–7.7)
Neutrophils Relative %: 92 %
Platelets: 263 10*3/uL (ref 150–400)
RBC: 4.23 MIL/uL (ref 3.87–5.11)
RDW: 12.6 % (ref 11.5–15.5)
WBC: 10.1 10*3/uL (ref 4.0–10.5)
nRBC: 0 % (ref 0.0–0.2)

## 2021-03-28 LAB — COMPREHENSIVE METABOLIC PANEL
ALT: 37 U/L (ref 0–44)
AST: 45 U/L — ABNORMAL HIGH (ref 15–41)
Albumin: 3.7 g/dL (ref 3.5–5.0)
Alkaline Phosphatase: 46 U/L (ref 38–126)
Anion gap: 7 (ref 5–15)
BUN: 7 mg/dL (ref 6–20)
CO2: 25 mmol/L (ref 22–32)
Calcium: 8.9 mg/dL (ref 8.9–10.3)
Chloride: 106 mmol/L (ref 98–111)
Creatinine, Ser: 0.73 mg/dL (ref 0.44–1.00)
GFR, Estimated: >60 mL/min (ref >60)
Glucose, Bld: 127 mg/dL — ABNORMAL HIGH (ref 70–99)
Potassium: 4.2 mmol/L (ref 3.5–5.1)
Sodium: 138 mmol/L (ref 135–145)
Total Bilirubin: 1.2 mg/dL (ref 0.3–1.2)
Total Protein: 6.5 g/dL (ref 6.5–8.1)

## 2021-03-28 LAB — RESP PANEL BY RT-PCR (FLU A&B, COVID) ARPGX2
Influenza A by PCR: NEGATIVE
Influenza B by PCR: NEGATIVE
SARS Coronavirus 2 by RT PCR: NEGATIVE

## 2021-03-28 LAB — TROPONIN I (HIGH SENSITIVITY)
Troponin I (High Sensitivity): 561 ng/L (ref <18)
Troponin I (High Sensitivity): 673 ng/L (ref <18)

## 2021-03-28 LAB — TSH: TSH: 1.253 u[IU]/mL (ref 0.350–4.500)

## 2021-03-28 LAB — MAGNESIUM: Magnesium: 1.8 mg/dL (ref 1.7–2.4)

## 2021-03-28 MED ORDER — MAGNESIUM SULFATE 2 GM/50ML IV SOLN
2.0000 g | Freq: Once | INTRAVENOUS | Status: AC
  Administered 2021-03-28: 20:00:00 2 g via INTRAVENOUS
  Filled 2021-03-28: qty 50

## 2021-03-28 MED ORDER — ONDANSETRON HCL 4 MG/2ML IJ SOLN: 4.0000 mg | Freq: Four times a day (QID) | INTRAMUSCULAR | Status: DC | PRN

## 2021-03-28 MED ORDER — TRAMADOL HCL 50 MG PO TABS: 50.0000 mg | ORAL_TABLET | Freq: Four times a day (QID) | ORAL | Status: DC | PRN

## 2021-03-28 MED ORDER — ENOXAPARIN SODIUM 40 MG/0.4ML IJ SOSY
40.0000 mg | PREFILLED_SYRINGE | INTRAMUSCULAR | Status: DC
  Administered 2021-03-28: 22:00:00 40 mg via SUBCUTANEOUS
  Filled 2021-03-28: qty 0.4

## 2021-03-28 MED ORDER — SODIUM CHLORIDE 0.9% FLUSH
3.0000 mL | Freq: Two times a day (BID) | INTRAVENOUS | Status: DC
  Administered 2021-03-28 – 2021-03-29 (×2): 3 mL via INTRAVENOUS

## 2021-03-28 MED ORDER — ACETAMINOPHEN 325 MG PO TABS: 650.0000 mg | ORAL_TABLET | ORAL | Status: DC | PRN

## 2021-03-28 MED ORDER — SODIUM CHLORIDE 0.9 % IV BOLUS
1000.0000 mL | Freq: Once | INTRAVENOUS | Status: AC
  Administered 2021-03-28: 17:00:00 1000 mL via INTRAVENOUS

## 2021-03-28 MED ORDER — FLUOXETINE HCL 20 MG PO CAPS
20.0000 mg | ORAL_CAPSULE | Freq: Every day | ORAL | Status: DC
  Administered 2021-03-28 – 2021-03-29 (×2): 20 mg via ORAL
  Filled 2021-03-28 (×2): qty 1

## 2021-03-28 MED ORDER — SODIUM CHLORIDE 0.9% FLUSH: 3.0000 mL | INTRAVENOUS | Status: DC | PRN

## 2021-03-28 MED ORDER — SODIUM CHLORIDE 0.9 % IV SOLN: 250.0000 mL | INTRAVENOUS | Status: DC | PRN

## 2021-03-28 MED ORDER — DUTASTERIDE 0.5 MG PO CAPS
0.5000 mg | ORAL_CAPSULE | Freq: Every day | ORAL | Status: DC
  Administered 2021-03-29: 10:00:00 0.5 mg via ORAL
  Filled 2021-03-28 (×2): qty 1

## 2021-03-28 MED ORDER — METOPROLOL SUCCINATE ER 25 MG PO TB24
12.5000 mg | ORAL_TABLET | Freq: Every day | ORAL | Status: DC
  Filled 2021-03-28 (×2): qty 1

## 2021-03-28 MED ORDER — ASPIRIN EC 81 MG PO TBEC
81.0000 mg | DELAYED_RELEASE_TABLET | Freq: Every day | ORAL | Status: DC
  Administered 2021-03-28 – 2021-03-29 (×2): 81 mg via ORAL
  Filled 2021-03-28 (×2): qty 1

## 2021-03-28 MED ORDER — SODIUM CHLORIDE 0.9 % IV BOLUS
1000.0000 mL | Freq: Once | INTRAVENOUS | Status: AC
  Administered 2021-03-28: 20:00:00 1000 mL via INTRAVENOUS

## 2021-03-28 NOTE — ED Triage Notes (Signed)
Pt arrived to ED via EMS w/ c/o bigeminy. Pt had bladder sling procedure this morning at 0700. Pt went home around 1000. Pt denies pain or blood in urine. Around 1400 pt became lightheaded and dizzy. Pt check HR and noted it to be 40. EMS reports pt bigeminy upon arrival w/ rate of 90's. Initial BP 80/46 while lying. EMS started 20g R AC and gave 155mL NS bolus which brought BP to 98/54.

## 2021-03-28 NOTE — ED Notes (Signed)
Ambulated to restroom without assistance at this time 

## 2021-03-28 NOTE — ED Notes (Signed)
Pt was in NSR prior to ambulating to restroom. Returned from restroom placed pt back on monitor and she is now back in bigemeny. C/o lightheaded, chest heaviness, numbness in lt arm  at this time

## 2021-03-28 NOTE — H&P (Addendum)
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 Not running lately due to orthopedic issues. I saw her in 10/19 due to CP and dizziness after a run stress echo with EF 60-65% with mild to moderate MR and normal stress response.   Had coronary calcium score in 10/22 = 0  Has been feeling well. Underwent uncomplicated pelvic sling procedure today. After surgery went home and began to have palpitations and felt weak. Found to have PVCs and brought to ER. In ER was in sinus with frequent monomorphic PVCs with bigeminy. SBP 70-80s. Received 1L IVF with SBP 90-105.   Labs back with Mg 1.8 K 4.2 hstrop  561  Denies significant CP or SOB.   I did bedside echo with EF ~40%  (hard to assess with PVCs) and possiblemild apical ballooning. Mild MR. RV ok    Review of Systems:     Cardiac Review of Systems: {Y] = yes [ ]  = no  Chest Pain [    ]  Resting SOB [   ] Exertional SOB  [  ]  Orthopnea [  ]   Pedal Edema [   ]    Palpitations [ y ] Syncope  [  ]   Presyncope [   ]  General Review of Systems: [Y] = yes [  ]=no Constitional: recent weight change [  ]; anorexia [  ]; fatigue [  ]; nausea [  ]; night sweats [  ]; fever [  ]; or chills [  ];                                                                     Dental: poor dentition[  ];   Eye : blurred vision [  ]; diplopia [   ]; vision changes [  ];  Amaurosis fugax[  ]; Resp: cough [  ];  wheezing[  ];  hemoptysis[  ]; shortness of breath[  ]; paroxysmal nocturnal dyspnea[  ]; dyspnea on exertion[  ]; or orthopnea[  ];  GI:  gallstones[  ], vomiting[  ];  dysphagia[  ]; melena[  ];  hematochezia [  ]; heartburn[  ];   GU: kidney stones [  ]; hematuria[  ];   dysuria [  ];  nocturia[  ];               Skin: rash [  ], swelling[  ];,  hair loss[  ];  peripheral edema[  ];  or itching[  ]; Musculosketetal: myalgias[  ];  joint swelling[  ];  joint erythema[  ];  joint pain[  ];  back pain[  ];  Heme/Lymph: bruising[  ];  bleeding[  ];  anemia[  ];  Neuro: TIA[  ];  headaches[  ];  stroke[  ];  vertigo[  ];  seizures[  ];   paresthesias[  ];  difficulty walking[  ];  Psych:depression[ y ]; [place]  ];  Endocrine: diabetes[  ];  thyroid dysfunction[  ];  Other:  Past Medical History:  Diagnosis Date   Antiphospholipid antibody syndrome ([place]) 12/21/2013   Recurrent miscarriage; 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   Cranberry-Vitamin C-Probiotic (AZO CRANBERRY) 250-30 MG TABS Take 2 tablets by mouth once.   dutasteride (AVODART) 0.5 MG capsule Take 0.5 mg by mouth daily.   DYANAVEL XR 15 MG CHER Take 1 tablet by mouth daily.   FLUoxetine (PROZAC) 20 MG capsule Take 20 mg by mouth daily.   ibuprofen (ADVIL) 200 MG tablet Take 600 mg by mouth once.   propranolol (INDERAL) 20 MG tablet Take 20 mg by mouth daily.   spironolactone (ALDACTONE) 50 MG tablet Take 50 mg by mouth daily.   VYVANSE 40 MG CHEW Chew 1 tablet by mouth daily.      Allergies  Allergen Reactions   Other Hives    Reaction to unknown narcotic post C-section    Social History   Socioeconomic History   Marital status: Married    Spouse name: Vonna Kotyk   Number of children: 2   Years of education: MD   Highest education level: Not on file  Occupational History    Employer: OTHER    Comment: Dermatology Specialist  Tobacco Use   Smoking status: Never   Smokeless tobacco: Never  Substance and Sexual Activity   Alcohol use: Yes    Alcohol/week: 1.0 standard drink     Types: 1 Standard drinks or equivalent per week   Drug use: No   Sexual activity: Yes    Birth control/protection: I.U.D.  Other Topics Concern   Not on file  Social History Narrative   Patient lives at home with family.   Caffeine use: 1 cup daily   Social Determinants of Health   Financial Resource Strain: Not on file  Food Insecurity: Not on file  Transportation Needs: Not on file  Physical Activity: Not on file  Stress: Not on file  Social Connections: Not on file  Intimate Partner Violence: Not on file    Family History  Problem Relation Age of Onset   Migraines Mother    Suicidality Father    Migraines Maternal Uncle     PHYSICAL EXAM: Vitals:   03/28/21 1815 03/28/21 1830  BP: 94/71 103/65  Pulse: (!) 42 (!) 42  Resp: 20 17  Temp:    SpO2: 100% 99%   General:  Lying in bed. No respiratory difficulty HEENT: normal Neck: supple. no JVD. Carotids 2+ bilat; no bruits. No lymphadenopathy or thryomegaly appreciated. Cor: PMI nondisplaced. irregular rate & rhythm. 2/6 MR Lungs: clear Abdomen: soft, nontender, nondistended. No hepatosplenomegaly. No bruits or masses. Good bowel sounds. Extremities: no cyanosis, clubbing, rash, edema Neuro: alert & oriented x 3, cranial nerves grossly intact. moves all 4 extremities w/o difficulty. Affect pleasant.  ECG: Sinus rhythm with ventricular bigeminy no ischemic changes  Results for orders placed or performed during the hospital encounter of 03/28/21 (from the past 24 hour(s))  TSH     Status: None   Collection Time: 03/28/21  4:05 PM  Result Value Ref Range   TSH 1.253 0.350 - 4.500 uIU/mL  CBC with Differential     Status: Abnormal   Collection  Time: 03/28/21  5:21 PM  Result Value Ref Range   WBC 10.1 4.0 - 10.5 K/uL   RBC 4.23 3.87 - 5.11 MIL/uL   Hemoglobin 13.8 12.0 - 15.0 g/dL   HCT 41.1 36.0 - 46.0 %   MCV 97.2 80.0 - 100.0 fL   MCH 32.6 26.0 - 34.0 pg   MCHC 33.6 30.0 - 36.0 g/dL   RDW 12.6 11.5 - 15.5 %    Platelets 263 150 - 400 K/uL   nRBC 0.0 0.0 - 0.2 %   Neutrophils Relative % 92 %   Neutro Abs 9.2 (H) 1.7 - 7.7 K/uL   Lymphocytes Relative 6 %   Lymphs Abs 0.6 (L) 0.7 - 4.0 K/uL   Monocytes Relative 2 %   Monocytes Absolute 0.2 0.1 - 1.0 K/uL   Eosinophils Relative 0 %   Eosinophils Absolute 0.0 0.0 - 0.5 K/uL   Basophils Relative 0 %   Basophils Absolute 0.0 0.0 - 0.1 K/uL   Immature Granulocytes 0 %   Abs Immature Granulocytes 0.04 0.00 - 0.07 K/uL  Comprehensive metabolic panel     Status: Abnormal   Collection Time: 03/28/21  5:21 PM  Result Value Ref Range   Sodium 138 135 - 145 mmol/L   Potassium 4.2 3.5 - 5.1 mmol/L   Chloride 106 98 - 111 mmol/L   CO2 25 22 - 32 mmol/L   Glucose, Bld 127 (H) 70 - 99 mg/dL   BUN 7 6 - 20 mg/dL   Creatinine, Ser 0.73 0.44 - 1.00 mg/dL   Calcium 8.9 8.9 - 10.3 mg/dL   Total Protein 6.5 6.5 - 8.1 g/dL   Albumin 3.7 3.5 - 5.0 g/dL   AST 45 (H) 15 - 41 U/L   ALT 37 0 - 44 U/L   Alkaline Phosphatase 46 38 - 126 U/L   Total Bilirubin 1.2 0.3 - 1.2 mg/dL   GFR, Estimated >60 >60 mL/min   Anion gap 7 5 - 15  Magnesium     Status: None   Collection Time: 03/28/21  5:21 PM  Result Value Ref Range   Magnesium 1.8 1.7 - 2.4 mg/dL  Troponin I (High Sensitivity)     Status: Abnormal   Collection Time: 03/28/21  5:21 PM  Result Value Ref Range   Troponin I (High Sensitivity) 561 (HH) <18 ng/L   No results found.   ASSESSMENT/Plan:  1. Symptomatic PVCs/ventricular bigeminy - discussed with Dr. Lovena Le in EP. Appear to be LVOT focus in setting of possible myocarditis or stress-induced cardiomyopathy - supp mag - continue hydration - cycle trops. - cMRI in am - can try low-dose b-blocker if persist - hold stimulants  2. Hypotension - due to #1 - improved with IVF  3. Elevated troponin  - as above, bedside echo EF ~40% with ? Mild apical ballooning - question possible myocarditis or stress-induced cardiomyopathy - doubt ACS  given normal CAC scoring and lack of ischemic symptoms or ECG changes - Admit to tele - cycle trops. - cMRI in am - can try low-dose b-blocker if persist - avoid heparin if possible with recent surgery (pelvic sling)  Glori Bickers, MD  7:08 PM

## 2021-03-28 NOTE — ED Provider Notes (Signed)
[facility] EMERGENCY DEPARTMENT Provider Note   CSN: [id number] Arrival date & time: 03/28/21  1534     History No chief complaint on file.   Carol Mckenzie is a 50 y.o. female.  The history is provided by the patient and medical records. No language interpreter was used.  Chest Pain Pain location:  L chest and substernal area Pain quality: aching   Pain radiates to:  Does not radiate Pain severity:  Moderate Onset quality:  Gradual Duration:  3 hours Timing:  Constant Progression:  Waxing and waning Chronicity:  New Relieved by:  Nothing Worsened by:  Nothing Ineffective treatments:  None tried Associated symptoms: fatigue, near-syncope and palpitations   Associated symptoms: no abdominal pain, no altered mental status, no back pain, no cough, no diaphoresis, no headache, no lower extremity edema, no nausea, no shortness of breath, no vomiting and no weakness       Past Medical History:  Diagnosis Date   Antiphospholipid antibody syndrome ([place]) 12/21/2013   Recurrent miscarriage; no thrombotic events   Depression    Migraine    Mitral valve prolapse 12/21/2013   MVP (mitral valve prolapse)    Does not get pre-treated w/abx - no problem per pt   PONV (postoperative nausea and vomiting)     Patient Active Problem List   Diagnosis Date Noted   Encounter for counseling 12/26/2020   Chronic migraine without aura without status migrainosus, not intractable 10/24/2020   Amenorrhea 08/26/2017   Nausea 08/26/2017   Antiphospholipid antibody syndrome complicating pregnancy ([place]) 03/01/2015   Spontaneous vaginal delivery 03/01/2015   Antiphospholipid antibody syndrome ([place]) 12/21/2013   Mitral valve prolapse 12/21/2013   Abdominal pregnancy with intrauterine pregnancy 12/21/2013   KNEE PAIN, RIGHT 05/10/2008   BUNIONETTE 05/10/2008   FOOT PAIN, RIGHT 05/10/2008   HYPERSOMNIA, PERSISTENT 07/14/2007    Past Surgical History:  Procedure Laterality  Date   ABDOMINOPLASTY     CAROTID ENDARTERECTOMY     CESAREAN SECTION     CHOLECYSTECTOMY     DILATION AND EVACUATION  05/09/2012   Procedure: DILATATION AND EVACUATION;  Surgeon: Farrel Gobble. Harrington Challenger, MD;  Location: [facility];  Service: Gynecology;  Laterality: N/A;   RHINOPLASTY     VENTRICULOPERITONEAL SHUNT     WISDOM TOOTH EXTRACTION       OB History     Gravida  6   Para  3   Term  3   Preterm      AB  3   Living  3      SAB  3   IAB      Ectopic      Multiple  0   Live Births  3           Family History  Problem Relation Age of Onset   Migraines Mother    Suicidality Father    Migraines Maternal Uncle     Social History   Tobacco Use   Smoking status: Never   Smokeless tobacco: Never  Substance Use Topics   Alcohol use: Yes    Alcohol/week: 1.0 standard drink    Types: 1 Standard drinks or equivalent per week   Drug use: No    Home Medications Prior to Admission medications   Medication Sig Start Date End Date Taking? Authorizing Provider  amphetamine-dextroamphetamine (ADDERALL XR) 20 MG 24 hr capsule Take 20 mg by mouth daily. 09/28/20   [provider]  dutasteride (AVODART) 0.5 MG capsule Take  0.5 mg by mouth daily. 02/06/21   [provider]  FLUoxetine (PROZAC) 20 MG capsule Take 20 mg by mouth daily.    [provider]  Fremanezumab-vfrm (AJOVY) 225 MG/1.5ML SOAJ Inject 225 mg into the skin every 30 (thirty) days. 10/24/20   Melvenia Beam, MD  levonorgestrel (MIRENA) 20 MCG/24HR IUD Mirena 20 mcg/24 hours (5 yrs) 52 mg intrauterine device  Take 1 device by intrauterine route.    [provider]  propranolol (INDERAL) 20 MG tablet propranolol 20 mg tablet    [provider]  spironolactone (ALDACTONE) 50 MG tablet spironolactone 50 mg tablet    [provider]    Allergies    Other  Review of Systems   Review of Systems  Constitutional:  Positive for fatigue. Negative for chills  and diaphoresis.  HENT:  Negative for congestion.   Respiratory:  Negative for cough, chest tightness, shortness of breath and wheezing.   Cardiovascular:  Positive for chest pain, palpitations and near-syncope.  Gastrointestinal:  Negative for abdominal pain, constipation, diarrhea, nausea and vomiting.  Genitourinary:  Negative for dysuria and flank pain.  Musculoskeletal:  Negative for back pain.  Neurological:  Positive for light-headedness. Negative for syncope, weakness and headaches.  Psychiatric/Behavioral:  Negative for agitation and confusion.   All other systems reviewed and are negative.  Physical Exam Updated Vital Signs There were no vitals taken for this visit.  Physical Exam Vitals and nursing note reviewed.  Constitutional:      General: She is not in acute distress.    Appearance: She is well-developed. She is not ill-appearing, toxic-appearing or diaphoretic.  HENT:     Head: Normocephalic and atraumatic.     Mouth/Throat:     Mouth: Mucous membranes are dry.     Pharynx: No oropharyngeal exudate or posterior oropharyngeal erythema.  Eyes:     Extraocular Movements: Extraocular movements intact.     Conjunctiva/sclera: Conjunctivae normal.     Pupils: Pupils are equal, round, and reactive to light.  Cardiovascular:     Rate and Rhythm: Bradycardia present. Rhythm irregular.     Heart sounds: No murmur heard. Pulmonary:     Effort: Pulmonary effort is normal. No respiratory distress.     Breath sounds: Normal breath sounds. No wheezing, rhonchi or rales.  Chest:     Chest wall: No tenderness.  Abdominal:     General: Abdomen is flat.     Palpations: Abdomen is soft.     Tenderness: There is no abdominal tenderness. There is no right CVA tenderness, left CVA tenderness, guarding or rebound.  Musculoskeletal:        General: No swelling.     Cervical back: Neck supple. No tenderness.  Skin:    General: Skin is warm and dry.     Capillary Refill: Capillary  refill takes less than 2 seconds.     Findings: No erythema.  Neurological:     General: No focal deficit present.     Mental Status: She is alert.     Sensory: No sensory deficit.     Motor: No weakness.  Psychiatric:        Mood and Affect: Mood normal.    ED Results / Procedures / Treatments   Labs (all labs ordered are listed, but only abnormal results are displayed) Labs Reviewed  CBC WITH DIFFERENTIAL/PLATELET - Abnormal; Notable for the following components:      Result Value   Neutro Abs 9.2 (*)  Lymphs Abs 0.6 (*)    All other components within normal limits  COMPREHENSIVE METABOLIC PANEL - Abnormal; Notable for the following components:   Glucose, Bld 127 (*)    AST 45 (*)    All other components within normal limits  TROPONIN I (HIGH SENSITIVITY) - Abnormal; Notable for the following components:   Troponin I (High Sensitivity) 561 (*)    All other components within normal limits  TROPONIN I (HIGH SENSITIVITY) - Abnormal; Notable for the following components:   Troponin I (High Sensitivity) 673 (*)    All other components within normal limits  RESP PANEL BY RT-PCR (FLU A&B, COVID) ARPGX2  MAGNESIUM  TSH  MAGNESIUM  BASIC METABOLIC PANEL  HIV ANTIBODY (ROUTINE TESTING W REFLEX)    EKG EKG Interpretation  Date/Time:  Tuesday March 28 2021 16:09:45 EST Ventricular Rate:  77 PR Interval:  165 QRS Duration: 88 QT Interval:  425 QTC Calculation: 481 R Axis:   77 Text Interpretation: Sinus rhythm Low voltage, precordial leads When compared to prior, similar appearance. No STEMI Confirmed by Antony Blackbird [phone]) on 03/28/2021 4:15:53 PM  Initial ECG EKG Interpretation  Date/Time:  Tuesday March 28 2021 16:09:45 EST Ventricular Rate:  77 PR Interval:  165 QRS Duration: 88 QT Interval:  425 QTC Calculation: 481 R Axis:   77 Text Interpretation: Sinus rhythm Low voltage, precordial leads When compared to prior, similar appearance. No STEMI  Confirmed by Antony Blackbird [phone]) on 03/28/2021 4:15:53 PM  Radiology No results found.  Procedures Procedures   Medications Ordered in ED Medications  traMADol (ULTRAM) tablet 50 mg (has no administration in time range)  FLUoxetine (PROZAC) capsule 20 mg (20 mg Oral Given 03/28/21 2143)  dutasteride (AVODART) capsule 0.5 mg (0.5 mg Oral Not Given 03/28/21 2143)  sodium chloride flush (NS) 0.9 % injection 3 mL (3 mLs Intravenous Given 03/28/21 2148)  sodium chloride flush (NS) 0.9 % injection 3 mL (has no administration in time range)  0.9 %  sodium chloride infusion (has no administration in time range)  acetaminophen (TYLENOL) tablet 650 mg (has no administration in time range)  ondansetron (ZOFRAN) injection 4 mg (has no administration in time range)  enoxaparin (LOVENOX) injection 40 mg ( Subcutaneous Canceled Entry 03/29/21 0000)  metoprolol succinate (TOPROL-XL) 24 hr tablet 12.5 mg (12.5 mg Oral Not Given 03/28/21 2144)  aspirin EC tablet 81 mg (81 mg Oral Given 03/28/21 2143)  sodium chloride 0.9 % bolus 1,000 mL (0 mLs Intravenous Stopped 03/28/21 1948)  magnesium sulfate IVPB 2 g 50 mL (0 g Intravenous Stopped 03/28/21 2056)  sodium chloride 0.9 % bolus 1,000 mL (1,000 mLs Intravenous New Bag/Given 03/28/21 1951)    ED Course  I have reviewed the triage vital signs and the nursing notes.  Pertinent labs & imaging results that were available during my care of the patient were reviewed by me and considered in my medical decision making (see chart for details).    MDM Rules/Calculators/A&P                         Carol Mckenzie is a 50 y.o. female with a past medical history significant for antiphospholipid syndrome, migraines, mitral valve prolapse, previous cholecystectomy, previous carotid arterectomy, and VP shunt who presents with lightheadedness, palpitations, and near syncope.  According to patient, she had a bladder sling procedure done this morning around 7 AM and  was recovering back at home without difficulty.  She reports  around 2 PM, she noticed some lightheadedness and fatigue and occasional palpitations.  She reports that when she tried to stand up she was getting very lightheaded.  She denied any actual syncopal episodes and reports some mild chest discomfort on the left side.  It is not pleuritic and not exertional.  She reports no new nausea, vomiting or other GI symptoms.  She is denying any lower abdominal discomfort or any evidence of complications with her procedure at this time.  Denies any urinary symptoms.  She also denies any recent fevers, chills congestion, or cough.  Per EMS, patient was in a bradycardic bigeminy pattern and was hypotensive with a blood pressure of 80/46 on their arrival and gradually improved when they gave her some fluids.  By the time I evaluated patient, her blood pressure was over 629 systolic while laying flat.  On my exam, lungs are clear and chest is nontender.  Abdomen is nontender.  I did not appreciate a loud murmur.  Good pulses in extremities.  Patient resting comfortably.  When she did sit up for lung auscultation she got lightheaded.  Clinically I suspect patient is either having a reaction to some medications she used for her recent procedure, electrolyte abnormality, or dehydration in preparation for her procedure.  She seems to respond to fluids will give her some more fluids and will also check electrolytes.  Due to the chest discomfort and arrhythmia, will check an EKG and watch her on telemetry.  We had a shared decision-making conversation and agreed to hold on the chest x-ray given her lack of any other infectious symptoms or any shortness of breath with it.  We also agreed to hold on a thromboembolic work-up as she describes her symptoms and she has a low suspicion for blood clot.  We will together hold on a DVT/PE work-up.  We also agreed to hold on urinalysis and she just had her procedure and she has no  symptoms there.  On my reassessment patient already started to feel better and denies any palpitations.  Anticipate rehydration with some fluids and assessment of all the labs.  If work-up is reassuring, anticipate transient arrhythmia after the procedure and discharge home with close follow-up.  Anticipate reassessment.   6:50 PM Patient's work-up began to return.  Troponin was surprisingly elevated at over 500.  Cardiology came and evaluated patient and they will admit for further management.  They did request 2 g of magnesium to be given for slightly low magnesium.  They will continue to follow.  They agree that they have a low suspicion for a thromboembolic etiology of symptoms and agree on holding on a work-up down that pathway.  They will admit for further management for these changes and abnormalities.   Final Clinical Impression(s) / ED Diagnoses Final diagnoses:  Bigeminy  Chest pain, unspecified type  Palpitations  Elevated troponin     Clinical Impression: 1. Bigeminy   2. Chest pain, unspecified type   3. Palpitations   4. Elevated troponin     Disposition: Admit  This note was prepared with assistance of Dragon voice recognition software. Occasional wrong-word or sound-a-like substitutions may have occurred due to the inherent limitations of voice recognition software.     Tikia Skilton, Gwenyth Allegra, MD 03/28/21 [phone]

## 2021-03-28 NOTE — Plan of Care (Signed)
?  Problem: Clinical Measurements: ?Goal: Ability to maintain clinical measurements within normal limits will improve ?Outcome: Progressing ?Goal: Will remain free from infection ?Outcome: Progressing ?Goal: Diagnostic test results will improve ?Outcome: Progressing ?  ?

## 2021-03-29 ENCOUNTER — Inpatient Hospital Stay (HOSPITAL_COMMUNITY)
Admit: 2021-03-29 | Discharge: 2021-03-29 | Disposition: A | Discharge status: Home / Self Care | Payer: BC Managed Care – PPO | Attending: Adult Health | Admitting: Adult Health

## 2021-03-29 ENCOUNTER — Inpatient Hospital Stay (HOSPITAL_COMMUNITY): Payer: BC Managed Care – PPO

## 2021-03-29 DIAGNOSIS — I9589 Other hypotension: Secondary | ICD-10-CM | POA: Diagnosis not present

## 2021-03-29 DIAGNOSIS — I5189 Other ill-defined heart diseases: Secondary | ICD-10-CM

## 2021-03-29 DIAGNOSIS — R778 Other specified abnormalities of plasma proteins: Secondary | ICD-10-CM

## 2021-03-29 DIAGNOSIS — I428 Other cardiomyopathies: Secondary | ICD-10-CM | POA: Diagnosis not present

## 2021-03-29 DIAGNOSIS — I5181 Takotsubo syndrome: Secondary | ICD-10-CM

## 2021-03-29 HISTORY — DX: Takotsubo syndrome: I51.81

## 2021-03-29 LAB — MAGNESIUM: Magnesium: 2 mg/dL (ref 1.7–2.4)

## 2021-03-29 LAB — ECHOCARDIOGRAM COMPLETE
Area-P 1/2: 3.56 cm2
Calc EF: 62.1 %
Height: 64 in
Radius: 0.3 cm
S' Lateral: 2.9 cm
Single Plane A2C EF: 63.4 %
Single Plane A4C EF: 60.6 %
Weight: 2337.6 oz

## 2021-03-29 LAB — BASIC METABOLIC PANEL
Anion gap: 8 (ref 5–15)
BUN: 7 mg/dL (ref 6–20)
CO2: 20 mmol/L — ABNORMAL LOW (ref 22–32)
Calcium: 7.9 mg/dL — ABNORMAL LOW (ref 8.9–10.3)
Chloride: 104 mmol/L (ref 98–111)
Creatinine, Ser: 0.6 mg/dL (ref 0.44–1.00)
GFR, Estimated: >60 mL/min (ref >60)
Glucose, Bld: 98 mg/dL (ref 70–99)
Potassium: 3.8 mmol/L (ref 3.5–5.1)
Sodium: 132 mmol/L — ABNORMAL LOW (ref 135–145)

## 2021-03-29 LAB — HIV ANTIBODY (ROUTINE TESTING W REFLEX): HIV Screen 4th Generation wRfx: NONREACTIVE

## 2021-03-29 MED ORDER — METOPROLOL SUCCINATE ER 25 MG PO TB24: 25.0000 mg | ORAL_TABLET | Freq: Every day | ORAL | Status: DC

## 2021-03-29 MED ORDER — METOPROLOL SUCCINATE ER 25 MG PO TB24: 25.0000 mg | ORAL_TABLET | Freq: Every day | ORAL | 6 refills | Status: DC

## 2021-03-29 MED ORDER — GADOBUTROL 1 MMOL/ML IV SOLN
6.0000 mL | Freq: Once | INTRAVENOUS | Status: AC | PRN
  Administered 2021-03-29: 12:00:00 6 mL via INTRAVENOUS

## 2021-03-29 NOTE — Progress Notes (Signed)
Paged PA due do BP of 82/56. While waiting for response Dr. Haroldine Laws arrived to floor and assessed patient. No new orders at this time.   Carol Mckenzie

## 2021-03-29 NOTE — Progress Notes (Signed)
Heart Failure Navigator Progress Note  Assessed for Heart & Vascular TOC clinic readiness.  Patient does not meet criteria due to prior to admission pt established with AHF clinic.   Navigator available for educational resources.   Pricilla Holm, MSN, RN Heart Failure Nurse Navigator [phone]

## 2021-03-29 NOTE — Progress Notes (Signed)
Patient discharging home with family. Zio patch was applied and instructions given by representative. Patient education completed and medication administration discussed. Patient will reach out to provider tomorrow to schedule follow up appointment. All questions answered. Taken off tele and contacted CCMD. IV removed with no complications.   Carol Mckenzie

## 2021-03-29 NOTE — Discharge Summary (Addendum)
Advanced Heart Failure Team  Discharge Summary   Patient ID: Carol Mckenzie MRN: [id number], DOB/AGE: 50-Sep-1972 50 y.o. 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   After surgery went home and began to have palpitations and felt weak. Found to have PVCs and brought to ER. In ER was in sinus with frequent monomorphic PVCs with bigeminy. HS trop [id number]. No ischemic symptoms. SBP 70-80s. Received 1L IVF with SBP 90-105. POCUS with EF ~ 40-45% with anteroseptal WMA  Admitted with symptomatic PVCs. Concern for stress induced cardiomopathy. Stimulants stopped. Formal Echo with EF 45%. Had cMRI with EF 45% with mid ventricular takotsubo. BP initially to low but improved after fluids. Started on 25 mg Toprol XL daily at bed. At discharge having rare PVCs.   Zio AT placed at discharge to follow for arrhythmias/quantify PVCs. Following up with Dr Haroldine Laws 04/14/20. Repeat ECHO in 2-3 months. Discussed avoid strenuous exercise for the next few weeks.    Discharge Vitals: Blood pressure 94/64, pulse 72, temperature 99.1 F (37.3 C), temperature source Oral, resp. rate 16, height 5\' 4"  (1.626 m), weight 66.3 kg, SpO2 96 %, unknown if currently breastfeeding.  Labs: Lab Results  Component Value Date   WBC 10.1 03/28/2021   HGB 13.8 03/28/2021   HCT 41.1 03/28/2021   MCV 97.2 03/28/2021   PLT 263 03/28/2021    Recent Labs  Lab 03/28/21 1721 03/29/21 0337  NA 138 132*  K 4.2 3.8  CL 106 104  CO2 25 20*  BUN 7 7  CREATININE 0.73 0.60  CALCIUM 8.9 7.9*  PROT 6.5  --   BILITOT 1.2  --   ALKPHOS  46  --   ALT 37  --   AST 45*  --   GLUCOSE 127* 98   No results found for: CHOL, HDL, LDLCALC, TRIG BNP (last 3 results) No results for input(s): BNP in the last 8760 hours.  ProBNP (last 3 results) No results for input(s): PROBNP in the last 8760 hours.   Diagnostic Studies/Procedures   DG Chest Port 1 View  Result Date: 03/29/2021 CLINICAL DATA:  No history provided. Previous CT scan noted family history of coronary artery disease. EXAM: PORTABLE CHEST 1 VIEW COMPARISON:  CT 01/23/2021.  Radiography 04/01/2020. FINDINGS: Heart size is normal. Mediastinal shadows are normal. Calcified granuloma again seen in the left upper lobe. The lungs are otherwise clear by radiography. No abnormal bone finding. IMPRESSION: No active cardiopulmonary disease. Calcified granuloma left upper lobe. No other abnormality visible by radiography. Electronically Signed   By: Nelson Chimes M.D.   On: 03/29/2021 08:55   MR CARDIAC MORPHOLOGY W WO CONTRAST  Result Date: 03/29/2021 CLINICAL DATA:  50F with antiphospholipid syndrome presented with PVCs, hypotension following pelvic sling surgery. Troponin 561 >673 EXAM: CARDIAC MRI TECHNIQUE: The patient was scanned on a 1.5 Tesla Siemens magnet. A dedicated cardiac coil was used. Functional imaging was done using Fiesta sequences. 2,3, and 4 chamber views were done to assess for RWMA's. Modified Simpson's rule using a short axis stack was used to calculate an ejection fraction on a dedicated work Conservation officer, nature. The patient received 6 cc of Gadavist. After 10  minutes inversion recovery sequences were used to assess for infiltration and scar tissue. CONTRAST:  6 cc  of Gadavist FINDINGS: Left ventricle: -Normal size -Mild systolic dysfunction. Focal akinesis of portion of basal to mid anterior/anteroseptal/anterolateral/inferior wall, with normal function in base and apical segments -Elevated native T2 values up to 62ms in basal anterior wall. Global T2  58ms -Elevated native T1 (up to 1191 ms in mid anteroseptum) and ECV (Up to 33% in mid anteroseptum). Global ECV 32% -No LGE LV EF:  45% (Normal 56-78%) Absolute volumes: LV EDV: 145mL (Normal 52-141 mL) LV ESV: 20mL (Normal 13-51 mL) LV SV: 36mL (Normal 33-97 mL) CO: 3.8L/min (Normal 2.7-6.0 L/min) Indexed volumes: LV EDV: 49mL/sq-m (Normal 41-81 mL/sq-m) LV ESV: 42mL/sq-m (Normal 12-21 mL/sq-m) LV SV: 32mL/sq-m (Normal 26-56 mL/sq-m) CI: 2.2L/min/sq-m (Normal 1.8-3.8 L/min/sq-m) Right ventricle: Normal size and systolic function. Focal apical hypokinesis. RV EF: 50% (Normal 47-80%) Absolute volumes: RV EDV: 119mL (Normal 58-154 mL) RV ESV: 37mL (Normal 12-68 mL) RV SV: 65mL (Normal 35-98 mL) CO: 3.6L/min (Normal 2.7-6 L/min) Indexed volumes: RV EDV: 28mL/sq-m (Normal 48-87 mL/sq-m) RV ESV: 71mL/sq-m (Normal 11-28 mL/sq-m) RV SV: 27mL/sq-m (Normal 27-57 mL/sq-m) CI: 2.1L/min/sq-m (Normal 1.8-3.8 L/min/sq-m) Left atrium: Mild enlargement Right atrium: Normal size Mitral valve: Mild regurgitation Aortic valve: No regurgitation Tricuspid valve: Trivial regurgitation Pulmonic valve: No regurgitation Aorta: Normal proximal ascending aorta Pericardium: Normal IMPRESSION: 1. 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 There is focal akinesis in portion of the mid anterior/anteroseptal/anterolateral/inferior walls, which is not in a coronary distribution and has normal function in base and apical segments. In addition, there is diffuse edema, with elevated native T1, T2, and ECV. There is no LGE. While meets criteria for acute myocarditis given elevated T1/T2/ECV, her presentation with troponin elevation following surgery, wall motion abnormalities in mid-ventricle, and diffuse edema suggest more likely mid-ventricular Takotsubo cardiomyopathy 2. Normal LV size with mild systolic dysfunction (EF 43%). Focal akinesis in portion of the mid anterior/anteroseptal/anterolateral/inferior walls 3. Normal RV  size and systolic function (EF 15%). Focal apical hypokinesis. Electronically Signed   By: Oswaldo Milian M.D.   On: 03/29/2021 13:23   ECHOCARDIOGRAM COMPLETE  Result Date: 03/29/2021    ECHOCARDIOGRAM REPORT   Patient Name:   Carol Mckenzie Date of Exam: 03/29/2021 Medical Rec #:  [id number]     Height:       64.0 in Accession #:    [id number]    Weight:       146.1 lb Date of Birth:  1970/12/25     BSA:          1.712 m Patient Age:    58 years      BP:           93/58 mmHg Patient Gender: F             HR:           68 bpm. Exam Location:  Inpatient Procedure: 2D Echo, Color Doppler and Cardiac Doppler Indications:    Elevated Troponin  History:        Patient has prior history of Echocardiogram examinations, most                 recent 01/20/2018. Mitral Valve Prolapse.  Sonographer:    Bernadene Person RDCS Referring Phys: [id number] Barre Aydelott R Mostafa Yuan IMPRESSIONS  1. Left ventricular ejection fraction, by estimation, is 50%. The left ventricle has low normal function. The left ventricle demonstrates regional wall motion abnormalities (see scoring diagram/findings for  description). Left ventricular diastolic parameters were normal. There is mild hypokinesis in the mid segments of virtually all left ventricular walls (most prominently in the anterior, anteroseptal, anterolateral and inferolateral walls and less obvious in the inferior wall), whilst the apical  and basal segments contract normally.  2. Right ventricular systolic function is normal. The right ventricular size is normal. There is normal pulmonary artery systolic pressure.  3. The mitral valve is normal in structure. No evidence of mitral valve regurgitation. No evidence of mitral stenosis.  4. The aortic valve is normal in structure. Aortic valve regurgitation is not visualized. No aortic stenosis is present.  5. The inferior vena cava is normal in size with greater than 50% respiratory variability, suggesting right atrial pressure of 3 mmHg.  Conclusion(s)/Recommendation(s): Unusual wall motion abnormality distribution does not correspond to typical coronary distribution. Consider stress cardiomyopathy (takotsubo syndrome) and repeat limited echo evalauation in a few weeks. FINDINGS  Left Ventricle: Left ventricular ejection fraction, by estimation, is 50%. The left ventricle has low normal function. The left ventricle demonstrates regional wall motion abnormalities. The left ventricular internal cavity size was normal in size. There is no left ventricular hypertrophy. Left ventricular diastolic parameters were normal.  LV Wall Scoring: The mid anteroseptal segment, mid inferolateral segment, mid anterolateral segment, mid inferoseptal segment, mid anterior segment, and mid inferior segment are hypokinetic. There is mild hypokinesis in the mid segments of virtually all left ventricular walls (most prominently in the anterior, anteroseptal, anterolateral and inferolateral walls and less obvious in the inferior wall), whilst the apical and basal segments contract normally. Right Ventricle: The right ventricular size is normal. No increase in right ventricular wall thickness. Right ventricular systolic function is normal. There is normal pulmonary artery systolic pressure. The tricuspid regurgitant velocity is 2.22 m/s, and  with an assumed right atrial pressure of 3 mmHg, the estimated right ventricular systolic pressure is 78.2 mmHg. Left Atrium: Left atrial size was normal in size. Right Atrium: Right atrial size was normal in size. Pericardium: There is no evidence of pericardial effusion. Mitral Valve: The mitral valve is normal in structure. No evidence of mitral valve regurgitation. No evidence of mitral valve stenosis. Tricuspid Valve: The tricuspid valve is normal in structure. Tricuspid valve regurgitation is not demonstrated. No evidence of tricuspid stenosis. Aortic Valve: The aortic valve is normal in structure. Aortic valve regurgitation is  not visualized. No aortic stenosis is present. Pulmonic Valve: The pulmonic valve was normal in structure. Pulmonic valve regurgitation is not visualized. No evidence of pulmonic stenosis. Aorta: The aortic root is normal in size and structure. Venous: The inferior vena cava is normal in size with greater than 50% respiratory variability, suggesting right atrial pressure of 3 mmHg. IAS/Shunts: No atrial level shunt detected by color flow Doppler.  LEFT VENTRICLE PLAX 2D LVIDd:         4.60 cm      Diastology LVIDs:         2.90 cm      LV e' medial:    8.94 cm/s LV PW:         0.70 cm      LV E/e' medial:  6.9 LV IVS:        0.90 cm      LV e' lateral:   10.30 cm/s LVOT diam:     2.10 cm      LV E/e' lateral: 6.0 LV SV:         96 LV SV Index:  56 LVOT Area:     3.46 cm  LV Volumes (MOD) LV vol d, MOD A2C: 98.0 ml LV vol d, MOD A4C: 114.0 ml LV vol s, MOD A2C: 35.9 ml LV vol s, MOD A4C: 44.9 ml LV SV MOD A2C:     62.1 ml LV SV MOD A4C:     114.0 ml LV SV MOD BP:      67.2 ml RIGHT VENTRICLE RV S prime:     21.90 cm/s TAPSE (M-mode): 2.9 cm LEFT ATRIUM             Index        RIGHT ATRIUM          Index LA diam:        3.20 cm 1.87 cm/m   RA Area:     9.85 cm LA Vol (A2C):   26.9 ml 15.71 ml/m  RA Volume:   19.10 ml 11.16 ml/m LA Vol (A4C):   33.1 ml 19.33 ml/m LA Biplane Vol: 31.8 ml 18.58 ml/m  AORTIC VALVE LVOT Vmax:   127.00 cm/s LVOT Vmean:  92.200 cm/s LVOT VTI:    0.278 m  AORTA Ao Root diam: 2.80 cm Ao Asc diam:  3.00 cm MITRAL VALVE               TRICUSPID VALVE MV Area (PHT): 3.56 cm    TR Peak grad:   19.7 mmHg MV Decel Time: 213 msec    TR Vmax:        222.00 cm/s MR PISA:        0.57 cm MR PISA Radius: 0.30 cm    SHUNTS MV E velocity: 61.80 cm/s  Systemic VTI:  0.28 m MV A velocity: 35.70 cm/s  Systemic Diam: 2.10 cm MV E/A ratio:  1.73 Mihai Croitoru MD Electronically signed by Sanda Klein MD Signature Date/Time: 03/29/2021/12:48:53 PM    Final     Discharge Medications   Allergies as  of 03/29/2021       Reactions   Other Hives   Reaction to unknown narcotic post C-section        Medication List     STOP taking these medications    Ajovy 225 MG/1.5ML Soaj Generic drug: Fremanezumab-vfrm   Dyanavel XR 15 MG Cher Generic drug: Amphetamine ER   ibuprofen 200 MG tablet Commonly known as: ADVIL   propranolol 20 MG tablet Commonly known as: INDERAL   spironolactone 50 MG tablet Commonly known as: ALDACTONE   Vyvanse 40 MG Chew Generic drug: Lisdexamfetamine Dimesylate       TAKE these medications    acetaminophen 500 MG tablet Commonly known as: TYLENOL Take 1,000 mg by mouth once.   AZO Cranberry 250-30 MG Tabs Take 2 tablets by mouth once.   dutasteride 0.5 MG capsule Commonly known as: AVODART Take 0.5 mg by mouth daily.   FLUoxetine 20 MG capsule Commonly known as: PROZAC Take 20 mg by mouth daily.   metoprolol succinate 25 MG 24 hr tablet Commonly known as: TOPROL-XL Take 1 tablet (25 mg total) by mouth at bedtime. Start taking on: March 30, 2021   traMADol 50 MG tablet Commonly known as: ULTRAM Take 50 mg by mouth every 6 (six) hours as needed for moderate pain.        Disposition   The patient will be discharged in stable condition to home. Discharge Instructions     Diet - low sodium heart healthy   Complete by: As directed  Increase activity slowly   Complete by: As directed           Duration of Discharge Encounter: Greater than 35 minutes   Signed, Amy Clegg  NP-C  03/29/2021, 1:56 PM  Agree with above. Will arrange f/u in HF Clinic with me.   Glori Bickers, MD  10:43 PM

## 2021-03-29 NOTE — Progress Notes (Signed)
°  Echocardiogram 2D Echocardiogram has been performed.  Carol Mckenzie 03/29/2021, 11:14 AM

## 2021-03-29 NOTE — Progress Notes (Signed)
Cardiology Rounding Note   Subjective:    Feels much better. No further PVCs. No CP or SOB. SBP in 80s but asymptomatic.   Bedside echo this am 55-60% MRI pending   Objective:   Weight Range:  Vital Signs:   Temp:  [97.9 F (36.6 C)-98.9 F (37.2 C)] 98.2 F (36.8 C) (12/21 0804) Pulse Rate:  [41-83] 75 (12/21 0941) Resp:  [12-20] 18 (12/21 0804) BP: (82-112)/(54-87) 97/54 (12/21 1014) SpO2:  [98 %-100 %] 98 % (12/21 0804) Weight:  [64.7 kg-67 kg] 66.3 kg (12/21 0549) Last BM Date: 03/26/21  Weight change: Filed Weights   03/28/21 1610 03/28/21 2056 03/29/21 0549  Weight: 64.7 kg 67 kg 66.3 kg    Intake/Output:   Intake/Output Summary (Last 24 hours) at 03/29/2021 1141 Last data filed at 03/29/2021 0730 Gross per 24 hour  Intake 2149.22 ml  Output 2050 ml  Net 99.22 ml     Physical Exam: General:  Well appearing. No resp difficulty HEENT: normal Neck: supple. JVP . Carotids 2+ bilat; no bruits. No lymphadenopathy or thryomegaly appreciated. Cor: PMI nondisplaced. Regular rate & rhythm. No rubs, gallops or murmurs. Lungs: clear Abdomen: soft, nontender, nondistended. No hepatosplenomegaly. No bruits or masses. Good bowel sounds. Extremities: no cyanosis, clubbing, rash, edema Neuro: alert & orientedx3, cranial nerves grossly intact. moves all 4 extremities w/o difficulty. Affect pleasant  Telemetry: Sinus 60-70s no PVCs Personally reviewed   Labs: Basic Metabolic Panel: Recent Labs  Lab 03/28/21 1721 03/29/21 0337  NA 138 132*  K 4.2 3.8  CL 106 104  CO2 25 20*  GLUCOSE 127* 98  BUN 7 7  CREATININE 0.73 0.60  CALCIUM 8.9 7.9*  MG 1.8 2.0    Liver Function Tests: Recent Labs  Lab 03/28/21 1721  AST 45*  ALT 37  ALKPHOS 46  BILITOT 1.2  PROT 6.5  ALBUMIN 3.7   No results for input(s): LIPASE, AMYLASE in the last 168 hours. No results for input(s): AMMONIA in the last 168 hours.  CBC: Recent Labs  Lab 03/28/21 1721  WBC 10.1   NEUTROABS 9.2*  HGB 13.8  HCT 41.1  MCV 97.2  PLT 263    Cardiac Enzymes: No results for input(s): CKTOTAL, CKMB, CKMBINDEX, TROPONINI in the last 168 hours.  BNP: BNP (last 3 results) No results for input(s): BNP in the last 8760 hours.  ProBNP (last 3 results) No results for input(s): PROBNP in the last 8760 hours.    Other results:  Imaging: DG Chest Port 1 View  Result Date: 03/29/2021 CLINICAL DATA:  No history provided. Previous CT scan noted family history of coronary artery disease. EXAM: PORTABLE CHEST 1 VIEW COMPARISON:  CT 01/23/2021.  Radiography 04/01/2020. FINDINGS: Heart size is normal. Mediastinal shadows are normal. Calcified granuloma again seen in the left upper lobe. The lungs are otherwise clear by radiography. No abnormal bone finding. IMPRESSION: No active cardiopulmonary disease. Calcified granuloma left upper lobe. No other abnormality visible by radiography. Electronically Signed   By: Nelson Chimes M.D.   On: 03/29/2021 08:55     Medications:     Scheduled Medications:  aspirin EC  81 mg Oral Daily   dutasteride  0.5 mg Oral Daily   enoxaparin (LOVENOX) injection  40 mg Subcutaneous Q24H   FLUoxetine  20 mg Oral Daily   metoprolol succinate  12.5 mg Oral Daily   sodium chloride flush  3 mL Intravenous Q12H    Infusions:  sodium chloride  PRN Medications: sodium chloride, acetaminophen, ondansetron (ZOFRAN) IV, sodium chloride flush, traMADol   Assessment/Plan:   1. Symptomatic PVCs/ventricular bigeminy - discussed with Dr. Lovena Le in EP. Appear to be LVOT focus in setting of possible myocarditis or stress-induced cardiomyopathy - now resolved - cMRI today - BP too low for b-blockers - hold stimulants   2. Hypotension - BP soft but stable   3. Elevated troponin  - as above, bedside echo EF ~40% with ? Mild apical ballooning - question possible myocarditis or stress-induced cardiomyopathy - doubt ACS given normal CAC scoring  and lack of ischemic symptoms or ECG changes - hstrop has plateaued. No CP or SOB - EF normal now that PVCs resolved - await cMRI  Possibly home later today with Zio      Length of Stay: 1   Glori Bickers MD 03/29/2021, 11:41 AM  Advanced Heart Failure Team Pager [phone] (M-F; [place])  Please contact [facility] for night-coverage after hours (4p -7a ) and weekends on [email or web address]

## 2021-03-29 NOTE — TOC Initial Note (Signed)
Transition of Care [facility]) - Initial/Assessment Note    Patient Details  Name: Carol Mckenzie MRN: [id number] Date of Birth: April 02, 1971  [facility] CM/SW Contact:    Erenest Rasher, RN Phone Number: [phone] 03/29/2021, 2:32 PM  Clinical Narrative:                 HF TOC CM spoke to pt and states husband at home to assist with care. Pt waiting to be connected to Zio. Scheduled dc home today.  Expected Discharge Plan: Home/Self Care   Patient Goals and CMS Choice  Expected Discharge Plan and Services Expected Discharge Plan: Home/Self Care   Discharge Planning Services: CM Consult   Living arrangements for the past 2 months: Single Family Home Expected Discharge Date: 03/29/21                 Prior Living Arrangements/Services Living arrangements for the past 2 months: Single Family Home Lives with:: Spouse Patient language and need for interpreter reviewed:: Yes Do you feel safe going back to the place where you live?: Yes      Need for Family Participation in Patient Care: No (Comment) Care giver support system in place?: No (comment)   Criminal Activity/Legal Involvement Pertinent to Current Situation/Hospitalization: No - Comment as needed  Activities of Daily Living Home Assistive Devices/Equipment: None ADL Screening (condition at time of admission) Patient's cognitive ability adequate to safely complete daily activities?: Yes Is the patient deaf or have difficulty hearing?: No Does the patient have difficulty seeing, even when wearing glasses/contacts?: No Does the patient have difficulty concentrating, remembering, or making decisions?: No Patient able to express need for assistance with ADLs?: Yes Does the patient have difficulty dressing or bathing?: No Independently performs ADLs?: Yes (appropriate for developmental age) Does the patient have difficulty walking or climbing stairs?: No Weakness of Legs: None Weakness of Arms/Hands:  None  Permission Sought/Granted Permission sought to share information with : Case Manager, Family Supports, PCP Permission granted to share information with : Yes, Verbal Permission Granted  Share Information with NAME: Rufus Cypert     Permission granted to share info w Relationship: husband  Permission granted to share info w Contact Information: [phone]  Emotional Assessment   Attitude/Demeanor/Rapport: Gracious Affect (typically observed): Accepting Orientation: : Oriented to Self, Oriented to Place, Oriented to  Time, Oriented to Situation   Psych Involvement: No (comment)  Admission diagnosis:  Palpitations [R00.2] Bigeminy [I49.8] Elevated troponin [R77.8] Chest pain, unspecified type [R07.9] Patient Active Problem List   Diagnosis Date Noted   Elevated troponin level not due myocardial infarction 03/28/2021   Symptomatic PVCs 03/28/2021   Elevated troponin 03/28/2021   Encounter for counseling 12/26/2020   Chronic migraine without aura without status migrainosus, not intractable 10/24/2020   Amenorrhea 08/26/2017   Nausea 08/26/2017   Antiphospholipid antibody syndrome complicating pregnancy ([place]) 03/01/2015   Spontaneous vaginal delivery 03/01/2015   Antiphospholipid antibody syndrome ([place]) 12/21/2013   Mitral valve prolapse 12/21/2013   Abdominal pregnancy with intrauterine pregnancy 12/21/2013   KNEE PAIN, RIGHT 05/10/2008   BUNIONETTE 05/10/2008   FOOT PAIN, RIGHT 05/10/2008   HYPERSOMNIA, PERSISTENT 07/14/2007   PCP:  Shon Baton, MD Pharmacy:   [facility] [place], [place] [facility] [place] [address]  [place] [address] Phone: [phone] Fax: [place] [address] [place] [address] Phone: [phone] Fax: [phone]     Social Determinants of Health ([place])  Interventions    Readmission Risk Interventions No flowsheet data found.

## 2021-04-14 ENCOUNTER — Encounter (HOSPITAL_COMMUNITY): Payer: Self-pay | Admitting: Internal Medicine

## 2021-04-14 ENCOUNTER — Other Ambulatory Visit: Payer: Self-pay

## 2021-04-14 ENCOUNTER — Ambulatory Visit (HOSPITAL_COMMUNITY)
Admission: RE | Admit: 2021-04-14 | Discharge: 2021-04-14 | Disposition: A | Discharge status: Home / Self Care | Payer: BC Managed Care – PPO | Source: Ambulatory Visit | Attending: Internal Medicine | Admitting: Internal Medicine

## 2021-04-14 VITALS — BP 108/66 | HR 52 | Wt 150.8 lb

## 2021-04-14 DIAGNOSIS — I493 Ventricular premature depolarization: Secondary | ICD-10-CM | POA: Diagnosis not present

## 2021-04-14 DIAGNOSIS — I5181 Takotsubo syndrome: Secondary | ICD-10-CM

## 2021-04-14 DIAGNOSIS — R008 Other abnormalities of heart beat: Secondary | ICD-10-CM | POA: Diagnosis not present

## 2021-04-14 MED ORDER — METOPROLOL SUCCINATE ER 25 MG PO TB24: 25.0000 mg | ORAL_TABLET | Freq: Two times a day (BID) | ORAL | 11 refills | Status: DC

## 2021-04-14 NOTE — Progress Notes (Signed)
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    After surgery went home and began to have palpitations and felt weak. Found to have PVCs and brought to ER. In ER was in sinus with frequent monomorphic PVCs with bigeminy. HS trop [id number]. No ischemic symptoms. SBP 70-80s. Received 1L IVF with SBP 90-105. POCUS with EF ~ 40-45% with anteroseptal WMA   Admitted with symptomatic PVCs. Concern for stress induced cardiomopathy. Stimulants stopped. Formal Echo with EF 45%. Had cMRI with EF 45% with mid ventricular takotsubo. BP initially to low but improved after fluids. Started on 25 mg Toprol XL daily at bed. At discharge having rare PVCs.    Zio AT placed at discharge to follow for arrhythmias/quantify PVCs. Following up with Dr Carol Mckenzie 04/14/20. Repeat ECHO in 2-3 months. Discussed avoid strenuous exercise for the next few weeks.   Here for post-hospital f/u. Feeling better. Still with some PVCs. Improved with bid metoprolol. Able to do some walking without a problem.   Zio: Sinus rhythm avg 63  (range 45-111). Rare PVCs (< 1.0%) Personally reviewed  Echo at bedside 55-60% with normalization of anterior septal WMA   ROS: All systems negative except as listed in HPI, PMH and Problem List.  SH:  Social History   Socioeconomic History   Marital status: Married    Spouse name: Carol Mckenzie   Number of children: 2   Years of education: MD   Highest education level: Not on file  Occupational History    Employer: OTHER    Comment: Dermatology Specialist  Tobacco Use   Smoking status: Never   Smokeless tobacco: Never  Substance and Sexual Activity   Alcohol use: Yes    Alcohol/week: 1.0 standard drink    Types: 1 Standard drinks or equivalent per week   Drug use: No    Sexual activity: Yes    Birth control/protection: I.U.D.  Other Topics Concern   Not on file  Social History Narrative   Patient lives at home with family.   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     metoprolol succinate (TOPROL-XL) 25 MG 24 hr tablet Take 25 mg by mouth 2 (two) times daily.     dutasteride (AVODART) 0.5 MG capsule Take 0.5 mg by mouth daily. (Patient not taking: Reported on 04/14/2021)     No current  facility-administered medications for this encounter.    Vitals:   04/14/21 1501  BP: 108/66  Pulse: (!) 52  SpO2: 100%  Weight: 68.4 kg (150 lb 12.8 oz)    PHYSICAL EXAM:  General:  Well appearing. No resp difficulty HEENT: normal Neck: supple. no JVD. Carotids 2+ bilat; no bruits. No lymphadenopathy or thryomegaly appreciated. Cor: PMI nondisplaced. Regular rate & rhythm. No rubs, gallops or murmurs. Lungs: clear Abdomen: soft, nontender, nondistended. No hepatosplenomegaly. No bruits or masses. Good bowel sounds. Extremities: no cyanosis, clubbing, rash, edema Neuro: alert & orientedx3, cranial nerves grossly intact.  moves all 4 extremities w/o difficulty. Affect pleasant   ASSESSMENT & PLAN:  1. 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 No LGE - resolved on bedside echo today - continue b-blocker - CAC 10/22 was 0 so doubt underlying ischemic substrate - Can resume gradual exercise program. Avoid high-intensity activity for 3 months  2. Symptomatic PVCs/ventricular bigeminy - in setting of #1 - Zio 12/22 rare PVCs - hold stimulants    Glori Bickers, MD  3:24 PM

## 2021-04-14 NOTE — Patient Instructions (Signed)
Follow up AS NEEDED 

## 2021-04-21 ENCOUNTER — Encounter: Payer: No Typology Code available for payment source | Admitting: Surgical

## 2021-06-02 ENCOUNTER — Other Ambulatory Visit: Payer: Self-pay | Admitting: Neurology

## 2021-06-02 DIAGNOSIS — G43709 Chronic migraine without aura, not intractable, without status migrainosus: Secondary | ICD-10-CM

## 2021-06-02 MED ORDER — AJOVY 225 MG/1.5ML ~~LOC~~ SOAJ: 225.0000 mg | SUBCUTANEOUS | 11 refills | Status: AC

## 2021-06-05 ENCOUNTER — Telehealth: Payer: Self-pay | Admitting: *Deleted

## 2021-06-05 NOTE — Telephone Encounter (Signed)
Approved today Effective from 06/05/2021 through 08/27/2021. Faxed approval to pharmacy. Received a receipt of confirmation.

## 2021-06-05 NOTE — Telephone Encounter (Signed)
Completed [place] [employer] on Cover My Meds. KEY: [id number]. Awaiting determination from [place].

## 2021-08-20 NOTE — Progress Notes (Signed)
? ?Cardiology Clinic Follow-up ?PCP: Shon Baton, MD ?Primary Cardiologist: DB ? ?HPI: ? 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  ?Underwent pelvic sling surgery on 03/28/21. After surgery went home and began to have palpitations and felt weak. Found to have PVCs and brought to ER. In ER was in sinus with frequent monomorphic PVCs with bigeminy. HS trop [id number]. No CP. SBP 70-80s. Received 1L IVF with SBP 90-105. POCUS EF ~ 40-45% with anteroseptal WMA Admitted. Formal Echo with EF 45%. Had cMRI with EF 45% with mid ventricular takotsubo. BP initially to low but improved after fluids. Started on 25 mg Toprol XL daily at bed. At discharge having rare PVCs.  ?  ?Zio AT placed at discharge to follow for arrhythmias/quantify PVCs. ? ?Bedside echo 1/23 Echo at bedside 55-60% with normalization of anterior septal WMA ? ?Zio 1/23: Sinus rhythm avg 63  (range 45-111). Rare PVCs (< 1.0%) Personally reviewed ? ?Here for f/u. Continues to struggle with daily PVCs. Some days can be very frequent. These are quite symptomatic for her with SOB and chest pressure. Has limited caffeine. Mild snoring. Negative sleep study in 2012  ? ?ROS: All systems negative except as listed in HPI, PMH and Problem List. ? ?SH:  ?Social History  ? ?Socioeconomic History  ? Marital status: Married  ?  Spouse name: Vonna Kotyk  ? Number of children: 2  ? Years of education: MD  ? Highest education level: Not on file  ?Occupational History  ?  Employer: OTHER  ?  Comment: Dermatology Specialist  ?Tobacco Use  ? Smoking status: Never  ? Smokeless tobacco: Never  ?Substance and Sexual Activity  ? Alcohol use: Yes  ?  Alcohol/week: 1.0 standard drink  ?  Types: 1 Standard drinks or equivalent per week  ? Drug use: No  ? Sexual activity: Yes  ?  Birth control/protection: I.U.D.  ?Other Topics Concern  ? Not on file  ?Social History Narrative  ?  Patient lives at home with family.  ? Caffeine use: 1 cup daily  ? ?Social Determinants of Health  ? ?Financial Resource Strain: Not on file  ?Food Insecurity: Not on file  ?Transportation Needs: Not on file  ?Physical Activity: Not on file  ?Stress: Not on file  ?Social Connections: Not on file  ?Intimate Partner Violence: Not on file  ? ? ?FH:  ?Family History  ?Problem Relation Age of Onset  ? Migraines Mother   ? Suicidality Father   ? Migraines Maternal Uncle   ? ? ?Past Medical History:  ?Diagnosis Date  ? Antiphospholipid antibody syndrome ([place]) 12/21/2013  ? 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 Depression   ? Migraine   ? Mitral valve prolapse 12/21/2013  ? MVP (mitral valve prolapse)   ? Does not get pre-treated w/abx - no problem per pt  ? PONV (postoperative nausea and vomiting)   ? ? ?Current Outpatient Medications  ?Medication Sig Dispense Refill  ? dutasteride (AVODART) 0.5 MG capsule Take 0.5 mg by mouth daily.    ? FLUoxetine (PROZAC) 20 MG capsule Take 20 mg by mouth daily.    ? Fremanezumab-vfrm (AJOVY) 225 MG/1.5ML SOAJ Inject 225 mg into the skin every 30 (thirty) days. 1.5 mL 11  ? lisdexamfetamine (VYVANSE) 40 MG capsule Take 40 mg by mouth every morning.    ? metoprolol succinate (TOPROL-XL) 25 MG 24 hr tablet  Take 25 mg by mouth daily.    ? spironolactone (ALDACTONE) 50 MG tablet Take 50 mg by mouth daily.    ? ?No current facility-administered medications for this encounter.  ? ? ?Vitals:  ? 08/21/21 1440  ?BP: 102/62  ?Pulse: (!) 56  ?SpO2: 97%  ?Weight: 67.1 kg (148 lb)  ? ? ? ?PHYSICAL EXAM: ? ?General:  Well appearing. No resp difficulty ?HEENT: normal ?Neck: supple. no JVD. Carotids 2+ bilat; no bruits. No lymphadenopathy or thryomegaly appreciated. ?Cor: PMI nondisplaced. Regular rate & rhythm. No rubs, gallops or murmurs. ?Lungs: clear ?Abdomen: soft, nontender, nondistended. No hepatosplenomegaly. No bruits or masses. Good bowel sounds. ?Extremities: no cyanosis, clubbing,  rash, edema ?Neuro: alert & orientedx3, cranial nerves grossly intact. moves all 4 extremities w/o difficulty. Affect pleasant ? ? ?Sinus brady 59 + 1 PVC Personally reviewed ? ? ?ASSESSMENT & PLAN: ? ?1. 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 No LGE ?- bedside echo 1/23 EF 55-60% with normalization of anterior septal WMA ?- CAC 10/22 was 0 so have doubted underlying ischemic substrate but now having ongoing episodes of chest pressure. Given findings on cMRI with increased T2 signal in LAD territory I think she needs coronary CTA to better define LAD anatomy.  ? ?2. Symptomatic PVCs/ventricular bigeminy ?- Zio 1/23 rare PVCs ?- now much more frequent ?- Repeat zio ?- switch Toprol to Diltiazem ?- Cut Vyvanse in half ?- if persist can consider ablation  ?  ? ?Glori Bickers, MD  ?3:03 PM ? ? ? ?

## 2021-08-21 ENCOUNTER — Ambulatory Visit (HOSPITAL_COMMUNITY)
Admission: RE | Admit: 2021-08-21 | Discharge: 2021-08-21 | Disposition: A | Discharge status: Home / Self Care | Payer: No Typology Code available for payment source | Source: Ambulatory Visit | Attending: Internal Medicine | Admitting: Internal Medicine

## 2021-08-21 ENCOUNTER — Other Ambulatory Visit (HOSPITAL_COMMUNITY): Payer: Self-pay | Admitting: Internal Medicine

## 2021-08-21 ENCOUNTER — Ambulatory Visit (HOSPITAL_COMMUNITY)
Admission: RE | Admit: 2021-08-21 | Discharge: 2021-08-21 | Disposition: A | Discharge status: Home / Self Care | Payer: BC Managed Care – PPO | Source: Ambulatory Visit | Attending: Internal Medicine | Admitting: Internal Medicine

## 2021-08-21 ENCOUNTER — Encounter (HOSPITAL_COMMUNITY): Payer: Self-pay | Admitting: Internal Medicine

## 2021-08-21 VITALS — BP 102/62 | HR 56 | Wt 148.0 lb

## 2021-08-21 DIAGNOSIS — R008 Other abnormalities of heart beat: Secondary | ICD-10-CM | POA: Insufficient documentation

## 2021-08-21 DIAGNOSIS — I493 Ventricular premature depolarization: Secondary | ICD-10-CM

## 2021-08-21 DIAGNOSIS — R079 Chest pain, unspecified: Secondary | ICD-10-CM | POA: Insufficient documentation

## 2021-08-21 DIAGNOSIS — Z01818 Encounter for other preprocedural examination: Secondary | ICD-10-CM | POA: Diagnosis not present

## 2021-08-21 DIAGNOSIS — Z79899 Other long term (current) drug therapy: Secondary | ICD-10-CM | POA: Diagnosis not present

## 2021-08-21 DIAGNOSIS — I341 Nonrheumatic mitral (valve) prolapse: Secondary | ICD-10-CM | POA: Insufficient documentation

## 2021-08-21 DIAGNOSIS — I5181 Takotsubo syndrome: Secondary | ICD-10-CM | POA: Diagnosis not present

## 2021-08-21 DIAGNOSIS — R0789 Other chest pain: Secondary | ICD-10-CM | POA: Insufficient documentation

## 2021-08-21 LAB — BASIC METABOLIC PANEL
Anion gap: 5 (ref 5–15)
BUN: 12 mg/dL (ref 6–20)
CO2: 28 mmol/L (ref 22–32)
Calcium: 9.1 mg/dL (ref 8.9–10.3)
Chloride: 103 mmol/L (ref 98–111)
Creatinine, Ser: 0.7 mg/dL (ref 0.44–1.00)
GFR, Estimated: >60 mL/min (ref >60)
Glucose, Bld: 91 mg/dL (ref 70–99)
Potassium: 4.3 mmol/L (ref 3.5–5.1)
Sodium: 136 mmol/L (ref 135–145)

## 2021-08-21 MED ORDER — DILTIAZEM HCL ER COATED BEADS 120 MG PO CP24: 120.0000 mg | ORAL_CAPSULE | Freq: Every day | ORAL | 3 refills | Status: DC

## 2021-08-21 NOTE — Patient Instructions (Signed)
Stop Metoprolol XL ? ?Start Diltiazem CD 120 mg daily at bedtime ? ?Your provider has recommended that  you wear a Zio Patch for 14 days.  This monitor will record your heart rhythm for our review.  IF you have any symptoms while wearing the monitor please press the button.  If you have any issues with the patch or you notice a red or orange light on it please call the company at [phone].  Once you remove the patch please mail it back to the company as soon as possible so we can get the results. ? ?Your physician has requested that you have cardiac CT. Cardiac computed tomography (CT) is a painless test that uses an x-ray machine to take clear, detailed pictures of your heart. For further information please visit [email or web address]. Please follow instruction sheet as given. ONCE APPROVED BY INSURANCE WE WILL CALL YOU TO SCHEDULE ? ?Your physician recommends that you schedule a follow-up appointment in: 4 months ? ?If you have any questions or concerns before your next appointment please send [place] a message through [place] or call our office at [phone].   ? ?TO LEAVE A MESSAGE FOR THE NURSE SELECT OPTION 2, PLEASE LEAVE A MESSAGE INCLUDING: ?YOUR NAME ?DATE OF BIRTH ?CALL BACK NUMBER ?REASON FOR CALL**this is important as we prioritize the call backs ? ?YOU WILL RECEIVE A CALL BACK THE SAME DAY AS LONG AS YOU CALL BEFORE 4:00 PM ? ?At the [facility], you and your health needs are our priority. As part of our continuing mission to provide you with exceptional heart care, we have created designated Provider Care Teams. These Care Teams include your primary Cardiologist (physician) and Advanced Practice Providers (APPs- Physician Assistants and Nurse Practitioners) who all work together to provide you with the care you need, when you need it.  ? ?You may see any of the following providers on your designated Care Team at your next follow up: ?Dr Glori Bickers ?Dr Loralie Champagne ?Darrick Grinder,  NP ?Lyda Jester, PA ?Jessica Milford,NP ?Marlyce Huge, PA ?Audry Riles, PharmD ? ? ?Please be sure to bring in all your medications bottles to every appointment.  ? ? ?CARDIAC CT INSTRUCTIONS ? ?Please arrive at the [facility] main entrance of [facility] at xx:xx AM (30-45 minutes prior to test start time) ? ?Proceed to the [facility] Radiology Department (First Floor). ? ?Please follow these instructions carefully (unless otherwise directed): ? ?Hold all erectile dysfunction medications at least 48 hours prior to test. ? ?On the Night Before the Test: ?Be sure to Drink plenty of water, but do not exceed your fluid restriction limit ?Do not consume any caffeinated/decaffeinated beverages or chocolate 12 hours prior to your test. ?Do not take any antihistamines 12 hours prior to your test. ? ?On the Day of the Test: ?Drink plenty of water. Do not drink any water within one hour of the test. ?Do not eat any food 4 hours prior to the test. ?You may take your regular medications prior to the test.  ? ?     ?After the Test: ?Drink plenty of water. ?After receiving IV contrast, you may experience a mild flushed feeling. This is normal. ?On occasion, you may experience a mild rash up to 24 hours after the test. This is not dangerous. If this occurs, you can take Benadryl 25 mg and increase your fluid intake. ?If you experience trouble breathing, this can be serious. If it is severe call 911 IMMEDIATELY. If  it is mild, please call our office. ?If you take any of these medications: Glipizide/Metformin, Avandament, Glucavance, please do not take 48 hours after completing test. ? ?

## 2021-09-06 ENCOUNTER — Telehealth (HOSPITAL_COMMUNITY): Payer: Self-pay | Admitting: *Deleted

## 2021-09-14 ENCOUNTER — Encounter (HOSPITAL_COMMUNITY): Payer: Self-pay | Admitting: Internal Medicine

## 2021-09-21 ENCOUNTER — Telehealth (HOSPITAL_COMMUNITY): Payer: Self-pay | Admitting: *Deleted

## 2021-09-21 NOTE — Telephone Encounter (Signed)
Attempted to call patient regarding upcoming cardiac CT appointment. °Left message on voicemail with name and callback number ° °Penne Rosenstock RN Navigator Cardiac Imaging °[facility] °[phone] Office °[phone] Cell ° °

## 2021-09-22 ENCOUNTER — Ambulatory Visit (HOSPITAL_COMMUNITY): Admission: RE | Admit: 2021-09-22 | Payer: BC Managed Care – PPO | Source: Ambulatory Visit

## 2021-11-09 ENCOUNTER — Other Ambulatory Visit: Payer: Self-pay | Admitting: Internal Medicine

## 2021-11-09 DIAGNOSIS — R1904 Left lower quadrant abdominal swelling, mass and lump: Secondary | ICD-10-CM

## 2021-11-10 ENCOUNTER — Ambulatory Visit
Admission: RE | Admit: 2021-11-10 | Discharge: 2021-11-10 | Disposition: A | Discharge status: Home / Self Care | Payer: BC Managed Care – PPO | Source: Ambulatory Visit | Attending: Internal Medicine | Admitting: Internal Medicine

## 2021-11-10 DIAGNOSIS — R1904 Left lower quadrant abdominal swelling, mass and lump: Secondary | ICD-10-CM

## 2021-11-14 ENCOUNTER — Other Ambulatory Visit: Payer: Self-pay | Admitting: Internal Medicine

## 2021-11-14 DIAGNOSIS — R1904 Left lower quadrant abdominal swelling, mass and lump: Secondary | ICD-10-CM

## 2021-12-07 IMAGING — DX DG CHEST 2V
2 series · 2 of 2 positions shown · non-contrast
Comparison: Radiograph 06/01/2014

CLINICAL DATA: Productive cough

EXAM:
CHEST - 2 VIEW

[chest pa]
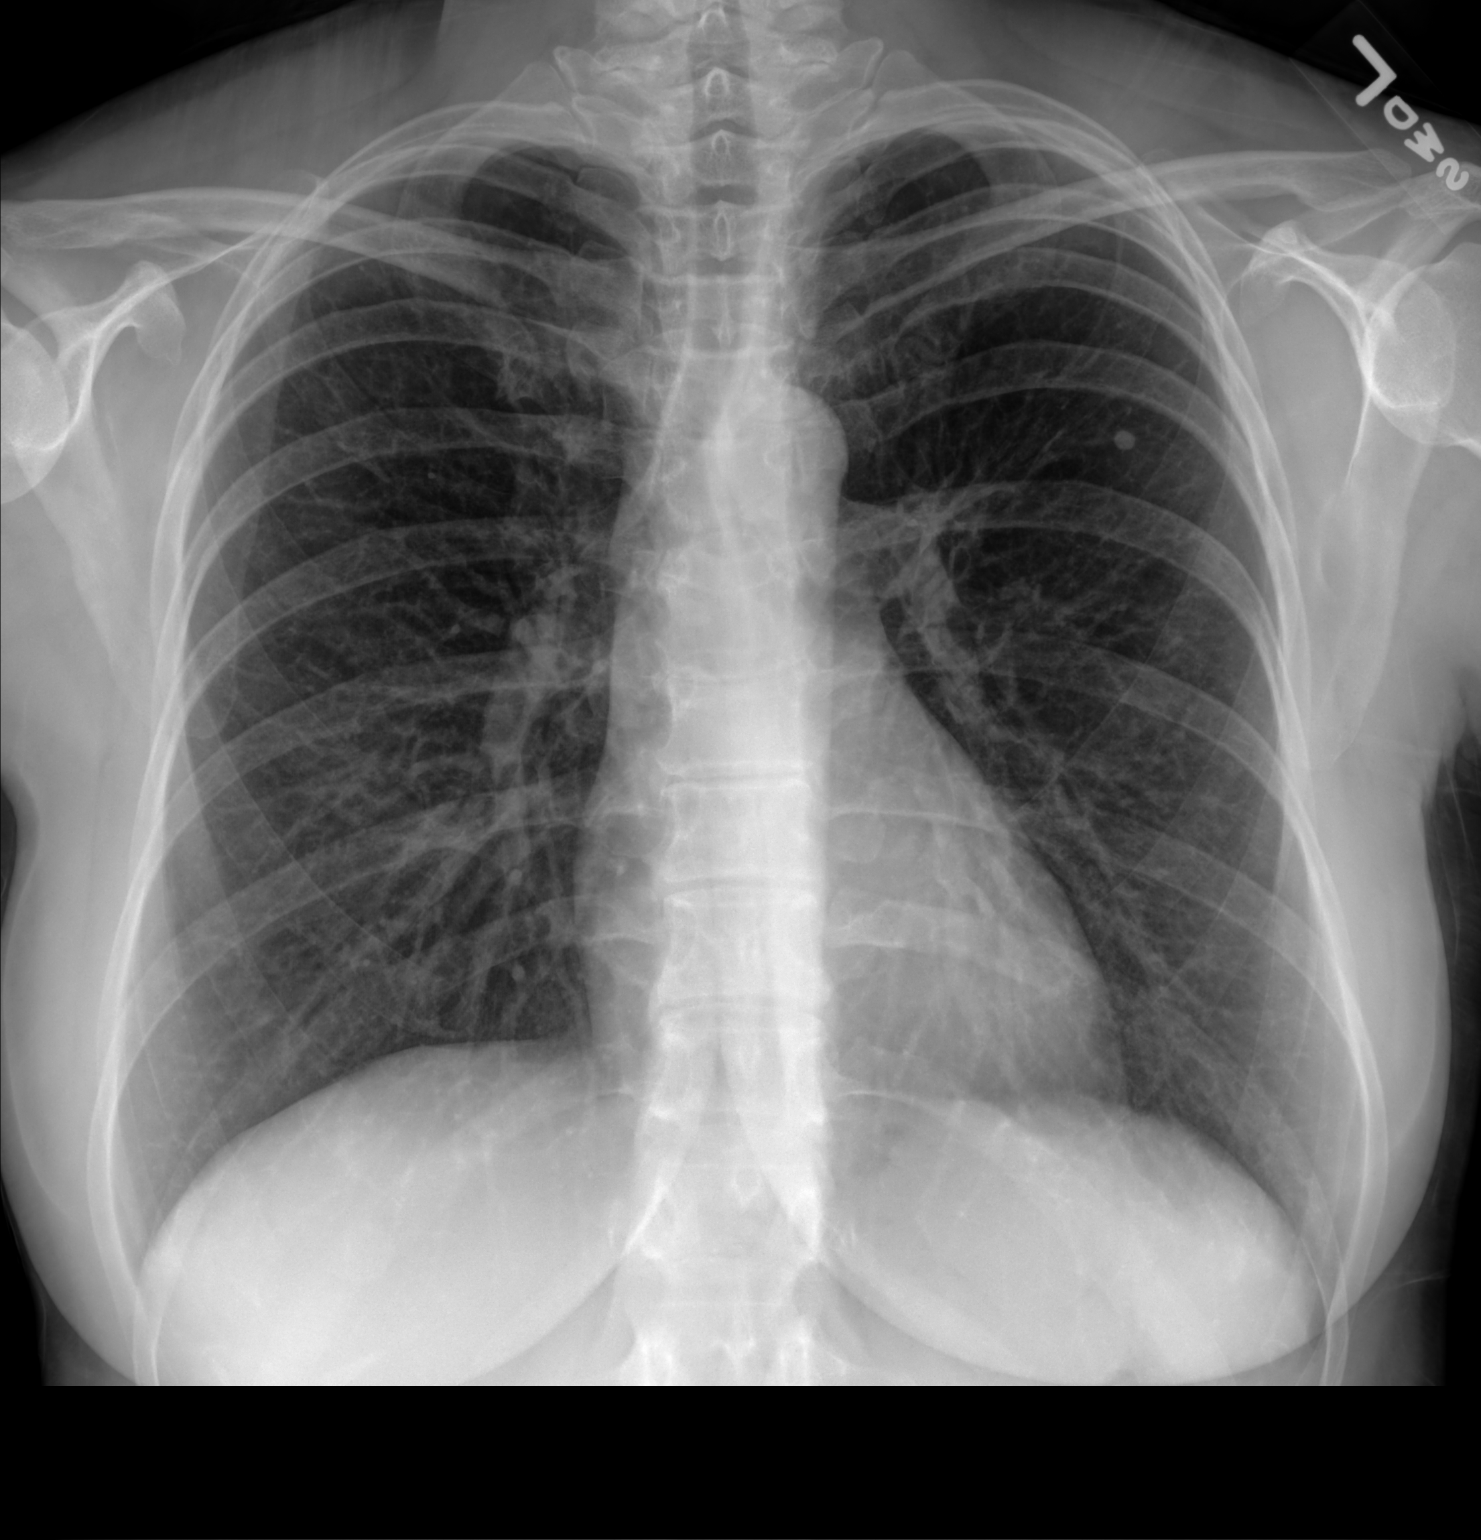

[chest lat]
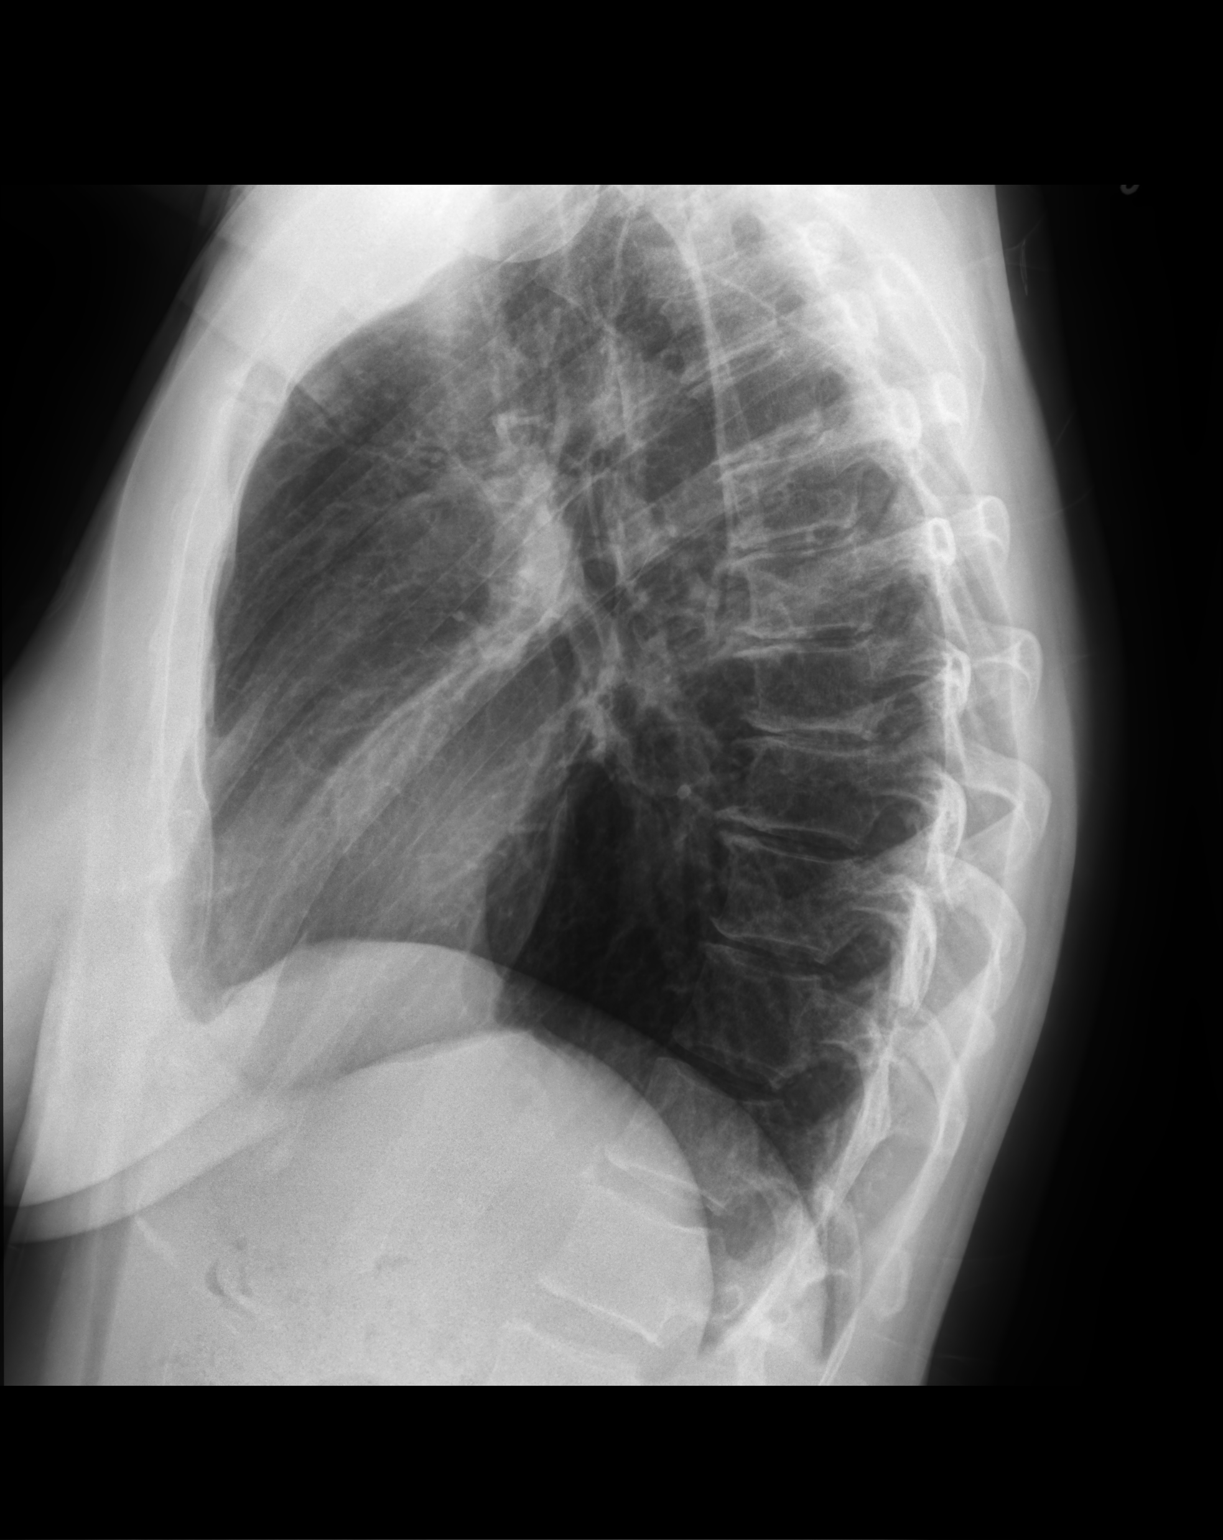

[2 of 2 positions shown; findings below may reference images not displayed]

FINDINGS: Normal mediastinum and cardiac silhouette. Normal pulmonary
vasculature. No evidence of effusion, infiltrate, or pneumothorax.
No acute bony abnormality. Postcholecystectomy

Calcified nodule in the RIGHT upper lobe unchanged from 4566.
IMPRESSION: No active cardiopulmonary disease.

## 2021-12-22 ENCOUNTER — Ambulatory Visit
Admission: RE | Admit: 2021-12-22 | Discharge: 2021-12-22 | Disposition: A | Discharge status: Home / Self Care | Payer: BC Managed Care – PPO | Source: Ambulatory Visit | Attending: Internal Medicine | Admitting: Internal Medicine

## 2021-12-22 DIAGNOSIS — R1904 Left lower quadrant abdominal swelling, mass and lump: Secondary | ICD-10-CM

## 2021-12-22 MED ORDER — IOPAMIDOL (ISOVUE-300) INJECTION 61%
100.0000 mL | Freq: Once | INTRAVENOUS | Status: AC | PRN
  Administered 2021-12-22: 100 mL via INTRAVENOUS

## 2021-12-29 ENCOUNTER — Encounter (HOSPITAL_COMMUNITY): Payer: No Typology Code available for payment source | Admitting: Internal Medicine

## 2022-03-28 ENCOUNTER — Telehealth (HOSPITAL_COMMUNITY): Payer: Self-pay | Admitting: Vascular Surgery

## 2022-03-28 NOTE — Telephone Encounter (Signed)
Left pt message to move appt

## 2022-03-30 ENCOUNTER — Encounter (HOSPITAL_COMMUNITY): Payer: No Typology Code available for payment source | Admitting: Internal Medicine

## 2022-04-23 ENCOUNTER — Encounter (HOSPITAL_COMMUNITY): Payer: No Typology Code available for payment source | Admitting: Internal Medicine

## 2022-05-02 ENCOUNTER — Encounter (HOSPITAL_COMMUNITY): Payer: No Typology Code available for payment source | Admitting: Internal Medicine

## 2022-07-01 NOTE — Progress Notes (Signed)
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 After surgery went home and began to have palpitations and felt weak. Found to have PVCs and brought to ER. In ER was in sinus with frequent monomorphic PVCs with bigeminy. HS trop [id number]. No CP. SBP 70-80s. POCUS EF ~ 40-45% with anteroseptal WMA Admitted. Formal Echo with EF 45%. Had cMRI with EF 45% with mid ventricular takotsubo.  Zio AT placed at discharge to follow for arrhythmias/quantify PVCs.  Bedside echo 1/23 Echo at bedside 55-60% with normalization of anterior septal WMA  Zio 1/23: Sinus rhythm avg 63  (range 45-111). Rare PVCs (< 1.0%) Personally reviewed  At last visit cardiac CTA ordered. Not completed. PVCs also more frequent. Toprol switch to diltiazem and Vyvanse cut in half. Zio placed.   Zio 6/23: Sinus avg 66. Four brief runs SVT. 1.9% PVCs  Here for f/u. Feeling pretty good. Was able to run a 5k this weekend but still struggling with periods of exercise intolerance with exertional dyspnea. Occasional chest tightness. Has occasional tachypalpitations. PVCs improved. Had lipids with Dr. Virgina Mckenzie last month. No edema, orthopnea or PND. Has lost 10 pounds.  ROS: All systems negative except as listed in HPI, PMH and Problem List.  SH:  Social History   Socioeconomic History   Marital status: Married    Spouse name: Carol Mckenzie   Number of children: 2   Years of education: MD   Highest education level: Not on file  Occupational History    Employer: OTHER    Comment: Dermatology Specialist  Tobacco Use   Smoking status: Never   Smokeless tobacco: Never  Substance and Sexual Activity   Alcohol use: Yes    Alcohol/week: 1.0 standard drink of alcohol    Types: 1 Standard drinks or  equivalent per week   Drug use: No   Sexual activity: Yes    Birth control/protection: I.U.D.  Other Topics Concern   Not on file  Social History Narrative   Patient lives at home with family.   Caffeine use: 1 cup daily   Social Determinants of Health   Financial Resource Strain: Not on file  Food Insecurity: Not on file  Transportation Needs: Not on file  Physical Activity: Not on file  Stress: Not on file  Social Connections: Not on file  Intimate Partner Violence: Not on file    FH:  Family History  Problem Relation Age of Onset   Migraines Mother    Suicidality Father    Migraines Maternal Uncle     Past Medical History:  Diagnosis Date   Antiphospholipid antibody syndrome ([place]) 12/21/2013   Recurrent miscarriage; no thrombotic events   Depression    Migraine    Mitral valve prolapse 12/21/2013   MVP (mitral valve prolapse)    Does not get pre-treated w/abx - no problem per pt   PONV (postoperative nausea and vomiting)     Current Outpatient Medications  Medication Sig Dispense Refill   diltiazem (CARDIZEM CD) 120 MG 24 hr capsule Take 1 capsule (120 mg total) by mouth at bedtime. 90 capsule 3   dutasteride (AVODART) 0.5 MG capsule Take 0.5 mg by mouth daily.     FLUoxetine (PROZAC) 20 MG capsule Take 20 mg by mouth daily.  Fremanezumab-vfrm (AJOVY) 225 MG/1.5ML SOAJ Inject 225 mg into the skin every 30 (thirty) days. 1.5 mL 11   lisdexamfetamine (VYVANSE) 40 MG capsule Take 40 mg by mouth every morning.     propranolol (INDERAL) 20 MG tablet Take 1 tablet by mouth as needed.     spironolactone (ALDACTONE) 50 MG tablet Take 50 mg by mouth daily.     No current facility-administered medications for this encounter.    Vitals:   07/02/22 1041  BP: 96/70  Pulse: 64  SpO2: 99%  Weight: 63.3 kg (139 lb 9.6 oz)   Wt Readings from Last 3 Encounters:  07/02/22 63.3 kg (139 lb 9.6 oz)  08/21/21 67.1 kg (148 lb)  04/14/21 68.4 kg (150 lb 12.8 oz)      PHYSICAL EXAM:  General:  Well appearing. No resp difficulty HEENT: normal Neck: supple. no JVD. Carotids 2+ bilat; no bruits. No lymphadenopathy or thryomegaly appreciated. Cor: PMI nondisplaced. Regular rate & rhythm. No rubs, gallops or murmurs. Lungs: clear Abdomen: soft, nontender, nondistended. No hepatosplenomegaly. No bruits or masses. Good bowel sounds. Extremities: no cyanosis, clubbing, rash, edema Neuro: alert & orientedx3, cranial nerves grossly intact. moves all 4 extremities w/o difficulty. Affect pleasant   ASSESSMENT & PLAN:  1. 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 No LGE - bedside echo 1/23 EF 55-60% with normalization of anterior septal WMA - CAC 10/22 was 0 so have doubted underlying ischemic substrate but now having ongoing exertional dyspnea, episodes of chest pressures and LAD scar on cMRI, I think we need to proceed with coronary CTA to better define LAD anatomy.   2. Symptomatic PVCs/ventricular bigeminy - Zio 1/23 rare PVCs - Zio 6/23: Sinus avg 66. Four brief runs SVT. 1.9% PVCs - Toprol switched to Diltiazem with improvement. Can use PRN if palpitations are improved - if PVCs recur can consider ablation     Carol Bickers, MD  11:05 AM

## 2022-07-02 ENCOUNTER — Ambulatory Visit (HOSPITAL_COMMUNITY)
Admission: RE | Admit: 2022-07-02 | Discharge: 2022-07-02 | Disposition: A | Discharge status: Home / Self Care | Payer: No Typology Code available for payment source | Source: Ambulatory Visit | Attending: Internal Medicine | Admitting: Internal Medicine

## 2022-07-02 ENCOUNTER — Encounter (HOSPITAL_COMMUNITY): Payer: Self-pay | Admitting: Internal Medicine

## 2022-07-02 VITALS — BP 96/70 | HR 64 | Wt 139.6 lb

## 2022-07-02 DIAGNOSIS — R0609 Other forms of dyspnea: Secondary | ICD-10-CM | POA: Insufficient documentation

## 2022-07-02 DIAGNOSIS — R008 Other abnormalities of heart beat: Secondary | ICD-10-CM | POA: Insufficient documentation

## 2022-07-02 DIAGNOSIS — I493 Ventricular premature depolarization: Secondary | ICD-10-CM | POA: Diagnosis present

## 2022-07-02 DIAGNOSIS — I5181 Takotsubo syndrome: Secondary | ICD-10-CM

## 2022-07-02 DIAGNOSIS — I471 Supraventricular tachycardia, unspecified: Secondary | ICD-10-CM | POA: Insufficient documentation

## 2022-07-02 DIAGNOSIS — Z79899 Other long term (current) drug therapy: Secondary | ICD-10-CM | POA: Insufficient documentation

## 2022-07-02 NOTE — Patient Instructions (Signed)
There has been no changes to your medications.  Labs done today, your results will be available in MyChart, we will contact you for abnormal readings.  Your physician has requested that you have an echocardiogram. Echocardiography is a painless test that uses sound waves to create images of your heart. It provides your doctor with information about the size and shape of your heart and how well your heart's chambers and valves are working. This procedure takes approximately one hour. There are no restrictions for this procedure. Please do NOT wear cologne, perfume, aftershave, or lotions (deodorant is allowed). Please arrive 15 minutes prior to your appointment time.  Your physician recommends that you schedule a follow-up appointment in: 12 months ( March 2025 ) ** please call the office in December to arrange your follow up appointment. **  If you have any questions or concerns before your next appointment please send [place] a message through [place] or call our office at [phone].    TO LEAVE A MESSAGE FOR THE NURSE SELECT OPTION 2, PLEASE LEAVE A MESSAGE INCLUDING: YOUR NAME DATE OF BIRTH CALL BACK NUMBER REASON FOR CALL**this is important as we prioritize the call backs  YOU WILL RECEIVE A CALL BACK THE SAME DAY AS LONG AS YOU CALL BEFORE 4:00 PM  At the [facility], you and your health needs are our priority. As part of our continuing mission to provide you with exceptional heart care, we have created designated Provider Care Teams. These Care Teams include your primary Cardiologist (physician) and Advanced Practice Providers (APPs- Physician Assistants and Nurse Practitioners) who all work together to provide you with the care you need, when you need it.   You may see any of the following providers on your designated Care Team at your next follow up: Dr Glori Bickers Dr Loralie Champagne Dr. Roxana Hires, NP Lyda Jester, [place] [facility] [place], [place] Forestine Na, NP Audry Riles, PharmD   Please be sure to bring in all your medications bottles to every appointment.    Thank you for choosing [facility]

## 2022-07-03 LAB — LIPOPROTEIN A (LPA): Lipoprotein (a): 11.6 nmol/L (ref <75.0)

## 2022-07-12 ENCOUNTER — Telehealth: Payer: Self-pay | Admitting: *Deleted

## 2022-07-12 NOTE — Telephone Encounter (Signed)
No pre cert reqd for Cardiac CT call ref #[id number]:52am  Insurance call back # [phone]

## 2022-07-23 ENCOUNTER — Ambulatory Visit (HOSPITAL_COMMUNITY)
Admission: RE | Admit: 2022-07-23 | Discharge: 2022-07-23 | Disposition: A | Discharge status: Home / Self Care | Payer: No Typology Code available for payment source | Source: Ambulatory Visit | Attending: Internal Medicine | Admitting: Internal Medicine

## 2022-07-23 DIAGNOSIS — I493 Ventricular premature depolarization: Secondary | ICD-10-CM | POA: Diagnosis present

## 2022-07-23 DIAGNOSIS — I341 Nonrheumatic mitral (valve) prolapse: Secondary | ICD-10-CM | POA: Diagnosis not present

## 2022-07-23 LAB — ECHOCARDIOGRAM COMPLETE
Area-P 1/2: 2.17 cm2
Calc EF: 63.8 %
MV M vel: 4.07 m/s
MV Peak grad: 66.1 mmHg
MV VTI: 2.61 cm2
S' Lateral: 3.1 cm
Single Plane A2C EF: 66.8 %
Single Plane A4C EF: 64.3 %

## 2022-07-23 NOTE — Progress Notes (Signed)
  Echocardiogram 2D Echocardiogram has been performed.  Milda Smart 07/23/2022, 9:59 AM

## 2022-08-13 ENCOUNTER — Other Ambulatory Visit (HOSPITAL_COMMUNITY): Payer: Self-pay | Admitting: Internal Medicine

## 2022-08-15 ENCOUNTER — Other Ambulatory Visit (HOSPITAL_COMMUNITY): Payer: Self-pay

## 2022-09-10 ENCOUNTER — Encounter (HOSPITAL_BASED_OUTPATIENT_CLINIC_OR_DEPARTMENT_OTHER): Payer: Self-pay | Admitting: Cardiology

## 2022-09-10 ENCOUNTER — Ambulatory Visit (HOSPITAL_BASED_OUTPATIENT_CLINIC_OR_DEPARTMENT_OTHER): Payer: No Typology Code available for payment source | Admitting: Cardiology

## 2022-09-10 VITALS — BP 110/68 | HR 59 | Ht 64.0 in | Wt 140.0 lb

## 2022-09-10 DIAGNOSIS — I5181 Takotsubo syndrome: Secondary | ICD-10-CM

## 2022-09-10 DIAGNOSIS — I493 Ventricular premature depolarization: Secondary | ICD-10-CM

## 2022-09-10 DIAGNOSIS — Z7189 Other specified counseling: Secondary | ICD-10-CM | POA: Diagnosis not present

## 2022-09-10 NOTE — Progress Notes (Signed)
Cardiology Office Note:    Date:  09/10/2022   ID:  Carol Mckenzie, DOB 1971/01/08, MRN [id number]  PCP:  Creola Corn, MD  Cardiologist:  Jodelle Red, MD  Referring MD: Dolores Patty, MD   CC: New patient evaluation for PVC's  History of Present Illness:    Carol Mckenzie is a 52 y.o. 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  Cardiac history:  She has followed with Dr. Gala Romney since 01/2018, clinic notes personally reviewed. Initially was seen for exertional chest pain with presyncope and had a stress echocardiogram showing LVEF 60-65%, normal diastolic function, mild to moderate mitral regurgitation. Underwent pelvic sling surgery on 03/28/21. After surgery she developed palpitations and weakness. She was brought to the ER where she was found to be in sinus rhythm with frequent monomorphic PVCs with bigeminy. HS trop [id number]. No CP. SBP 70-80s. POCUS EF ~ 40-45% with anteroseptal WMA. She was admitted to the hospital. Formal Echo with EF 45%. Had cMRI with EF 45% with mid ventricular takotsubo. Bedside echo 1/23 Echo at bedside 55-60% with normalization of anterior septal WMA. Zio 1/23: Sinus rhythm avg 63  (range 45-111). Rare PVCs (< 1.0%).   Her PVCs became more frequent. Toprol was switched to diltiazem and Vyvanse cut in half. Zio placed. Zio 6/23: Sinus avg 66. Four brief runs SVT. 1.9% PVCs.   She was last seen by Dr. Gala Romney 07/01/2022. She had recently run in a 5k event but struggled with some exercise intolerance and exertional dyspnea. Occasional chest tightness and tachypalpitations. PVCs improved. CAC 10/22 was 0 so had previously doubted underlying ischemic substrate. Given ongoing  exertional dyspnea, episodes of chest pressures and LAD scar on cMRI, it was felt she would benefit from a coronary CTA. She had an echo 07/23/2022 revealing LVEF 55-60%, normal diastolic parameters, mild mitral valve regurgitation, no valvular stenosis.  Tachycardia/palpitations: -Initial onset:  Years. -Frequency/Duration: Lately, not nearly as often or as bothersome as they used to be. If they occur, she does feel them but isn't limited.  -Associated symptoms: Chest discomfort every once in a blue moon associated with stress; hasn't recurred since her prior takotsubo cardiomyopathy. -Aggravating/alleviating factors: Not exacerbated with exercise.  -Syncope/near syncope: None. -Prior cardiac history:  She denies any prior endarterectomy. -Prior ECG:  History of PVC's, tachycardia. -Prior workup:  As above. Most recent echo 07/2022 with LVEF 55-60%. -Prior treatment: Taking propranolol for essential tremor. She also takes it at work of if feeling tachycardia episodes. -Comorbidities: She confirms having antiphospholipid syndrome. On Ajovy for migraine with aura.  -Exercise level:  Was previously running and participating in marathons. She states that she "feels like she is in a different place, like her heart can't take such heavy exercise anymore". She also notes that her heart rate won't increase significantly with exercise. Currently she is walk/running more often at a good pace without significant symptoms. -Diet:  Decent, admits to being able to make more changes. -Labs: TSH, kidney function/electrolytes, CBC reviewed. -Cardiac ROS: She denies any shortness of breath, peripheral edema, lightheadedness, syncope, orthopnea, or PND. -Family history:  Her father died at 28 yo, found to have incidental CAD on autopsy. One grandmother lived to 9 with hyperlipidemia; another lived to 1  and was generally healthy.     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 Tenny Craw, MD;  Location: [facility];  Service: Gynecology;  Laterality: N/A;   RHINOPLASTY     VENTRICULOPERITONEAL SHUNT     WISDOM TOOTH EXTRACTION      Current Medications: Current Outpatient Medications on File Prior to Visit  Medication Sig   dutasteride (AVODART) 0.5 MG capsule Take 0.5 mg by mouth daily.   FLUoxetine (PROZAC) 20 MG capsule Take 20 mg by mouth daily.   Fremanezumab-vfrm (AJOVY) 225 MG/1.5ML SOAJ Inject 225 mg into the skin every 30 (thirty) days.   lisdexamfetamine (VYVANSE) 40 MG capsule Take 40 mg by mouth every morning.   propranolol (INDERAL) 20 MG tablet Take 1 tablet by mouth as needed.   spironolactone (ALDACTONE) 50 MG tablet Take 50 mg by mouth daily.   No current facility-administered medications on file prior to visit.     Allergies:   Other   Social History   Tobacco Use   Smoking status: Never   Smokeless tobacco: Never  Substance Use Topics   Alcohol use: Yes    Alcohol/week: 1.0 standard drink of alcohol    Types: 1 Standard drinks or equivalent per week   Drug use: No    Family History: family history includes Migraines in her maternal uncle and mother; Suicidality in her father.  ROS:   Please see the history of present illness.  Additional pertinent ROS: Constitutional: Negative for chills, fever, night sweats, unintentional weight loss  HENT: Negative for ear pain and hearing loss.   Eyes: Negative for loss of vision and eye pain.   Respiratory: Negative for cough, sputum, wheezing.   Cardiovascular: See HPI. Gastrointestinal: Negative for abdominal pain, melena, and hematochezia.  Genitourinary: Negative for dysuria and hematuria.  Musculoskeletal: Negative for falls and myalgias.  Skin: Negative for itching and rash.  Neurological: Negative for focal weakness, focal sensory changes and loss of consciousness.  Endo/Heme/Allergies: Does not bruise/bleed easily.     EKGs/Labs/Other Studies Reviewed:    The following studies were reviewed today:  Echo  07/23/2022: Sonographer Comments: Image acquisition challenging due to respiratory  motion. Global longitudinal strain was attempted.   IMPRESSIONS   1. Left ventricular ejection fraction, by estimation, is 55 to 60%. The  left ventricle has normal function. The left ventricle has no regional  wall motion abnormalities. Left ventricular diastolic parameters were  normal.   2. Right ventricular systolic function is normal. The right ventricular  size is normal.   3. The mitral valve is normal in structure. Mild mitral valve  regurgitation. No evidence of mitral stenosis.   4. The aortic valve is normal in structure. Aortic valve regurgitation is  not visualized. No aortic stenosis is present.   5. The inferior vena cava is normal in size with greater than 50%  respiratory variability, suggesting right atrial pressure of  3 mmHg.   Monitor 09/2021: Patch Wear Time:  10 days and 15 hours (2023-05-15T15:23:56-0400 to 2023-05-26T07:22:23-0400)   1. Sinus rhythm - avg HR of 66 bpm. Predominant underlying rhythm was Sinus Rhythm. 2. Four runs of Supraventricular Tachycardia runs occurred, the run with the fastest interval lasting  13 beats with a max rate of 138 bpm (avg 131 bpm); the run with the fastest interval was also the longest. 3.  Isolated PACs were rare (<1.0%), SVE Couplets were rare (<1.0%), and no SVE Triplets were present. 4.  Isolated PVCs were  occasional (1.9%, 15223), and  no VE Couplets or VE Triplets were present.  5. Ventricular Bigeminy and Trigeminy were present. 6. Several patient-triggered events associated with isolated PVCs.  Cardiac MRI  03/29/2021: IMPRESSION: 1. 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 There is focal akinesis in portion of the mid anterior/anteroseptal/anterolateral/inferior walls, which is not in a coronary distribution and has normal function in base and apical segments. In addition, there is diffuse edema, with elevated native T1, T2, and ECV. There is no LGE. While meets criteria for acute myocarditis given elevated T1/T2/ECV, her presentation with troponin elevation following surgery, wall motion abnormalities in mid-ventricle, and diffuse edema suggest more likely mid-ventricular Takotsubo cardiomyopathy   2. Normal LV size with mild systolic dysfunction (EF 45%). Focal akinesis in portion of the mid anterior/anteroseptal/anterolateral/inferior walls   3. Normal RV size and systolic function (EF 50%). Focal apical hypokinesis.  Echo Stress Test  01/20/2018: Study Conclusions  - Left ventricle: Systolic function was normal. The estimated    ejection fraction was in the range of 60% to 65%.  - Mitral valve: There was mild to moderate regurgitation.  - Stress ECG conclusions: There were no stress arrhythmias or    conduction abnormalities. The stress ECG was negative for    ischemia.  - Staged echo: There was no echocardiographic evidence for    stress-induced ischemia.   Impressions:  - Normal LVEF 60-65%, normal diastolic function, mild to moderate    mitral regurgitation.    Normal stress test, normal hyperdynamic response to stress.    Excellent exercise capacity with exercising for 14:57 minutes and    achieving 17 METS.    EKG:  EKG is personally reviewed.   09/10/2022:  sinus bradycardia at 59 bpm  Recent Labs: No results found for requested  labs within last 365 days.   Recent Lipid Panel No results found for: "CHOL", "TRIG", "HDL", "CHOLHDL", "VLDL", "LDLCALC", "LDLDIRECT"  Physical Exam:    VS:  BP 110/68   Pulse (!) 59   Ht 5\' 4"  (1.626 m)   Wt 140 lb (63.5 kg)   BMI 24.03 kg/m     Wt Readings from Last 3 Encounters:  09/10/22 140 lb (63.5 kg)  07/02/22 139 lb 9.6 oz (63.3 kg)  08/21/21 148 lb (67.1 kg)    GEN: Well nourished, well developed in no acute distress HEENT: Normal, moist mucous membranes NECK: No JVD CARDIAC: regular rhythm, normal S1 and S2, no rubs or gallops. No murmur. VASCULAR: Radial and DP pulses 2+ bilaterally. No carotid bruits RESPIRATORY:  Clear to auscultation without rales, wheezing or rhonchi  ABDOMEN: Soft, non-tender, non-distended MUSCULOSKELETAL:  Ambulates independently SKIN: Warm and dry, no edema NEUROLOGIC:  Alert and oriented x 3. No focal neuro deficits noted. PSYCHIATRIC:  Normal affect    ASSESSMENT:    1. PVC's (premature ventricular contractions)   2. Stress-induced cardiomyopathy   3. Cardiac risk counseling   4.  Counseling on health promotion and disease prevention    PLAN:    PVCs -much improved symptomatically -most recent EF normal -she would like to trial off of diltiazem, which I agree with. If she has recurrent symptoms, she will contact me.  -reviewed red flag warning signs that need immediate medical attention  History of takotsubo cardiomyopathy 2022, with recovered EF  Cardiac risk counseling and prevention recommendations: -recommend heart healthy/Mediterranean diet, with whole grains, fruits, vegetable, fish, lean meats, nuts, and olive oil. Limit salt. -recommend moderate walking, 3-5 times/week for 30-50 minutes each session. Aim for at least 150 minutes.week. Goal should be pace of 3 miles/hours, or walking 1.5 miles in 30 minutes -recommend avoidance of tobacco products. Avoid excess alcohol. -ASCVD risk score: The ASCVD Risk score (Arnett  DK, et al., 2019) failed to calculate for the following reasons:   Cannot find a previous HDL lab   Cannot find a previous total cholesterol lab    Plan for follow up: 1 year or sooner as needed.  Jodelle Red, MD, PhD, [facility]  [facility]    Medication Adjustments/Labs and Tests Ordered: Current medicines are reviewed at length with the patient today.  Concerns regarding medicines are outlined above.   Orders Placed This Encounter  Procedures   EKG 12-Lead   No orders of the defined types were placed in this encounter.  Patient Instructions  Medication Instructions:  STOP- Diltiazem  *If you need a refill on your cardiac medications before your next appointment, please call your pharmacy*   Lab Work: None Ordered   Testing/Procedures: None Ordered   Follow-Up: At [facility], you and your health needs are our priority.  As part of our continuing mission to provide you with exceptional heart care, we have created designated Provider Care Teams.  These Care Teams include your primary Cardiologist (physician) and Advanced Practice Providers (APPs -  Physician Assistants and Nurse Practitioners) who all work together to provide you with the care you need, when you need it.  We recommend signing up for the patient portal called "MyChart".  Sign up information is provided on this After Visit Summary.  MyChart is used to connect with patients for Virtual Visits (Telemedicine).  Patients are able to view lab/test results, encounter notes, upcoming appointments, etc.  Non-urgent messages can be sent to your provider as well.   To learn more about what you can do with MyChart, go to [email or web address].    Your next appointment:   1 year(s)  Provider:   Jodelle Red, MD    Other Instructions     I,Mathew Stumpf,acting as a scribe for Jodelle Red, MD.,have documented all relevant documentation on the behalf of Jodelle Red, MD,as directed by  Jodelle Red, MD while in the presence of Jodelle Red, MD.  I, Jodelle Red, MD, have reviewed all documentation for this visit. The documentation on 09/10/22 for the exam, diagnosis, procedures, and orders are all accurate and complete.   Signed, Jodelle Red, MD PhD 09/10/2022 12:58 PM    [facility]

## 2022-09-10 NOTE — Patient Instructions (Signed)
Medication Instructions:  STOP- Diltiazem  *If you need a refill on your cardiac medications before your next appointment, please call your pharmacy*   Lab Work: None Ordered   Testing/Procedures: None Ordered   Follow-Up: At [facility], you and your health needs are our priority.  As part of our continuing mission to provide you with exceptional heart care, we have created designated Provider Care Teams.  These Care Teams include your primary Cardiologist (physician) and Advanced Practice Providers (APPs -  Physician Assistants and Nurse Practitioners) who all work together to provide you with the care you need, when you need it.  We recommend signing up for the patient portal called "MyChart".  Sign up information is provided on this After Visit Summary.  MyChart is used to connect with patients for Virtual Visits (Telemedicine).  Patients are able to view lab/test results, encounter notes, upcoming appointments, etc.  Non-urgent messages can be sent to your provider as well.   To learn more about what you can do with MyChart, go to [email or web address].    Your next appointment:   1 year(s)  Provider:   Jodelle Red, MD    Other Instructions

## 2023-06-22 ENCOUNTER — Ambulatory Visit (HOSPITAL_COMMUNITY)
Admission: EM | Admit: 2023-06-22 | Discharge: 2023-06-22 | Disposition: A | Discharge status: Home / Self Care | Attending: Emergency Medicine | Admitting: Emergency Medicine

## 2023-06-22 ENCOUNTER — Ambulatory Visit (INDEPENDENT_AMBULATORY_CARE_PROVIDER_SITE_OTHER)

## 2023-06-22 ENCOUNTER — Encounter (HOSPITAL_COMMUNITY): Payer: Self-pay | Admitting: Emergency Medicine

## 2023-06-22 ENCOUNTER — Other Ambulatory Visit: Payer: Self-pay

## 2023-06-22 DIAGNOSIS — J0100 Acute maxillary sinusitis, unspecified: Secondary | ICD-10-CM | POA: Diagnosis not present

## 2023-06-22 DIAGNOSIS — R051 Acute cough: Secondary | ICD-10-CM

## 2023-06-22 MED ORDER — BENZONATATE 100 MG PO CAPS: 100.0000 mg | ORAL_CAPSULE | Freq: Three times a day (TID) | ORAL | 0 refills | Status: AC

## 2023-06-22 MED ORDER — AMOXICILLIN-POT CLAVULANATE 875-125 MG PO TABS: 1.0000 | ORAL_TABLET | Freq: Two times a day (BID) | ORAL | 0 refills | Status: AC

## 2023-06-22 MED ORDER — PROMETHAZINE-DM 6.25-15 MG/5ML PO SYRP: 5.0000 mL | ORAL_SOLUTION | Freq: Every evening | ORAL | 0 refills | Status: AC | PRN

## 2023-06-22 NOTE — ED Provider Notes (Signed)
 [facility]    CSN: [id number] Arrival date & time: 06/22/23  1620      History   Chief Complaint Chief Complaint  Patient presents with   URI    HPI Carol Mckenzie is a 53 y.o. female.   Patient presents with cough, nasal congestion, sinus pressure/pain, chest congestion, fever, body aches, and chills.  Patient is unsure of when symptoms began exactly but states that they have been going on for over a week.  Patient states that she has been noticing thick green mucus from nose.  Denies chest pain, shortness of breath, abdominal pain, nausea, vomiting, and diarrhea.  Patient states she has been taking TheraFlu with minimal relief.   URI   Past Medical History:  Diagnosis Date   Antiphospholipid antibody syndrome (HCC) 12/21/2013   Recurrent miscarriage; no thrombotic events   Depression    Migraine    Mitral valve prolapse 12/21/2013   MVP (mitral valve prolapse)    Does not get pre-treated w/abx - no problem per pt   PONV (postoperative nausea and vomiting)    Takotsubo cardiomyopathy 03/29/2021    Patient Active Problem List   Diagnosis Date Noted   Elevated troponin level not due myocardial infarction 03/28/2021   Symptomatic PVCs 03/28/2021   Elevated troponin 03/28/2021   Encounter for counseling 12/26/2020   Migraine with aura 10/24/2020   Amenorrhea 08/26/2017   Nausea 08/26/2017   Antiphospholipid antibody syndrome complicating pregnancy (HCC) 03/01/2015   Spontaneous vaginal delivery 03/01/2015   Antiphospholipid antibody syndrome (HCC) 12/21/2013   Mitral valve prolapse 12/21/2013   Abdominal pregnancy with intrauterine pregnancy 12/21/2013   KNEE PAIN, RIGHT 05/10/2008   BUNIONETTE 05/10/2008   FOOT PAIN, RIGHT 05/10/2008   HYPERSOMNIA, PERSISTENT 07/14/2007    Past Surgical History:  Procedure Laterality Date   ABDOMINOPLASTY     CESAREAN SECTION     CHOLECYSTECTOMY     DILATION AND EVACUATION  05/09/2012   Procedure:  DILATATION AND EVACUATION;  Surgeon: Freddrick March. Tenny Craw, MD;  Location: [facility];  Service: Gynecology;  Laterality: N/A;   RHINOPLASTY     VENTRICULOPERITONEAL SHUNT     WISDOM TOOTH EXTRACTION      OB History     Gravida  6   Para  3   Term  3   Preterm      AB  3   Living  3      SAB  3   IAB      Ectopic      Multiple  0   Live Births  3            Home Medications    Prior to Admission medications   Medication Sig Start Date End Date Taking? Authorizing Provider  amoxicillin-clavulanate (AUGMENTIN) 875-125 MG tablet Take 1 tablet by mouth every 12 (twelve) hours. 06/22/23  Yes Susann Givens, Tryton Bodi A, NP  benzonatate (TESSALON) 100 MG capsule Take 1 capsule (100 mg total) by mouth every 8 (eight) hours. 06/22/23  Yes Wynonia Lawman A, NP  promethazine-dextromethorphan (PROMETHAZINE-DM) 6.25-15 MG/5ML syrup Take 5 mLs by mouth at bedtime as needed for cough. 06/22/23  Yes Susann Givens, Acsa Estey A, NP  dutasteride (AVODART) 0.5 MG capsule Take 0.5 mg by mouth daily. 02/06/21   [provider]  FLUoxetine (PROZAC) 20 MG capsule Take 20 mg by mouth daily.    [provider]  Fremanezumab-vfrm (AJOVY) 225 MG/1.5ML SOAJ Inject 225 mg into the skin every 30 (thirty) days. 06/02/21  Anson Fret, MD  lisdexamfetamine (VYVANSE) 40 MG capsule Take 40 mg by mouth every morning.    [provider]  propranolol (INDERAL) 20 MG tablet Take 1 tablet by mouth as needed.    [provider]  spironolactone (ALDACTONE) 50 MG tablet Take 50 mg by mouth daily. 07/21/21   [provider]    Family History Family History  Problem Relation Age of Onset   Migraines Mother    Suicidality Father    Migraines Maternal Uncle     Social History Social History   Tobacco Use   Smoking status: Never   Smokeless tobacco: Never  Substance Use Topics   Alcohol use: Yes    Alcohol/week: 1.0 standard drink of alcohol    Types: 1 Standard drinks or  equivalent per week   Drug use: No     Allergies   Other   Review of Systems Review of Systems  Per HPI  Physical Exam Triage Vital Signs ED Triage Vitals [06/22/23 1659]  Encounter Vitals Group     BP 121/78     Systolic BP Percentile      Diastolic BP Percentile      Pulse Rate (!) 58     Resp 19     Temp 98.7 F (37.1 C)     Temp Source Oral     SpO2 99 %     Weight      Height      Head Circumference      Peak Flow      Pain Score 5     Pain Loc      Pain Education      Exclude from Growth Chart    No data found.  Updated Vital Signs BP 121/78 (BP Location: Right Arm)   Pulse (!) 58   Temp 98.7 F (37.1 C) (Oral)   Resp 19   SpO2 99%   Visual Acuity Right Eye Distance:   Left Eye Distance:   Bilateral Distance:    Right Eye Near:   Left Eye Near:    Bilateral Near:     Physical Exam Vitals and nursing note reviewed.  Constitutional:      General: She is awake. She is not in acute distress.    Appearance: Normal appearance. She is well-developed and well-groomed. She is ill-appearing.  HENT:     Right Ear: Tympanic membrane, ear canal and external ear normal.     Left Ear: Tympanic membrane, ear canal and external ear normal.     Nose: Congestion and rhinorrhea present.     Right Sinus: Maxillary sinus tenderness present.     Left Sinus: Maxillary sinus tenderness present.     Mouth/Throat:     Mouth: Mucous membranes are moist.     Pharynx: Posterior oropharyngeal erythema present. No oropharyngeal exudate.  Cardiovascular:     Rate and Rhythm: Normal rate and regular rhythm.  Pulmonary:     Effort: Pulmonary effort is normal.     Breath sounds: Normal breath sounds.  Skin:    General: Skin is warm and dry.  Neurological:     Mental Status: She is alert.  Psychiatric:        Behavior: Behavior is cooperative.      UC Treatments / Results  Labs (all labs ordered are listed, but only abnormal results are displayed) Labs Reviewed  - No data to display  EKG   Radiology DG Chest 2 View Result Date: 06/22/2023  CLINICAL DATA:  Cough EXAM: CHEST - 2 VIEW COMPARISON:  03/29/2021 FINDINGS: Calcified granuloma in the left upper lobe. No confluent airspace opacities or effusions. Heart and mediastinal contours within normal limits. No acute bony abnormality. IMPRESSION: No active cardiopulmonary disease. Electronically Signed   By: Charlett Nose M.D.   On: 06/22/2023 17:59    Procedures Procedures (including critical care time)  Medications Ordered in UC Medications - No data to display  Initial Impression / Assessment and Plan / UC Course  I have reviewed the triage vital signs and the nursing notes.  Pertinent labs & imaging results that were available during my care of the patient were reviewed by me and considered in my medical decision making (see chart for details).     Upon assessment patient is ill-appearing.  Congestion and rhinorrhea present, mild erythema noted to pharynx.  Bilateral maxillary sinus tenderness is present.  Lungs clear bilaterally to auscultation.  Ordered chest x-ray to rule out presence of pneumonia due to persisting fever and ill appearance of patient.  Patient my interpretation there is no cardiopulmonary disease present on x-ray.  Radiology report confirms this.  Prescribed Augmentin for sinusitis.  Prescribed Tessalon and Promethazine DM for cough.  Discussed over-the-counter medication for symptoms.  Discussed return precautions. Final Clinical Impressions(s) / UC Diagnoses   Final diagnoses:  Acute cough  Acute non-recurrent maxillary sinusitis     Discharge Instructions      Your chest x-ray did not reveal any pneumonia today.  Start taking Augmentin twice daily for 7 days for sinus infection.  I have prescribed Tessalon that you can take every 8 hours as needed for cough.  I have also prescribed Promethazine DM that you can take at bedtime to help with cough.  This can  make you drowsy so do not work, drive, or drink alcohol while taking this.  Otherwise alternate between Tylenol and ibuprofen as needed for pain and fever.  You can also take Mucinex to help with cough and congestion.  Return here if symptoms persist or worsen.        ED Prescriptions     Medication Sig Dispense Auth. Provider   amoxicillin-clavulanate (AUGMENTIN) 875-125 MG tablet Take 1 tablet by mouth every 12 (twelve) hours. 14 tablet Susann Givens, Nahome Bublitz A, NP   benzonatate (TESSALON) 100 MG capsule Take 1 capsule (100 mg total) by mouth every 8 (eight) hours. 21 capsule Wynonia Lawman A, NP   promethazine-dextromethorphan (PROMETHAZINE-DM) 6.25-15 MG/5ML syrup Take 5 mLs by mouth at bedtime as needed for cough. 118 mL Wynonia Lawman A, NP      PDMP not reviewed this encounter.   Wynonia Lawman A, NP 06/22/23 1820

## 2023-06-22 NOTE — Discharge Instructions (Signed)
 Your chest x-ray did not reveal any pneumonia today.  Start taking Augmentin twice daily for 7 days for sinus infection.  I have prescribed Tessalon that you can take every 8 hours as needed for cough.  I have also prescribed Promethazine DM that you can take at bedtime to help with cough.  This can make you drowsy so do not work, drive, or drink alcohol while taking this.  Otherwise alternate between Tylenol and ibuprofen as needed for pain and fever.  You can also take Mucinex to help with cough and congestion.  Return here if symptoms persist or worsen.

## 2023-06-22 NOTE — ED Triage Notes (Signed)
 Pt reports increase cough, chest congestion, fever and chills for 5 days. Using Terraflu with no relief.

## 2023-09-25 ENCOUNTER — Other Ambulatory Visit: Payer: Self-pay | Admitting: Orthopedic Surgery

## 2023-09-25 DIAGNOSIS — G8929 Other chronic pain: Secondary | ICD-10-CM

## 2023-09-27 ENCOUNTER — Ambulatory Visit
Admission: RE | Admit: 2023-09-27 | Discharge: 2023-09-27 | Disposition: A | Discharge status: Home / Self Care | Source: Ambulatory Visit | Attending: Orthopedic Surgery | Admitting: Orthopedic Surgery

## 2023-09-27 DIAGNOSIS — G8929 Other chronic pain: Secondary | ICD-10-CM

## 2023-09-27 MED ORDER — IOPAMIDOL (ISOVUE-M 200) INJECTION 41%
10.0000 mL | Freq: Once | INTRAMUSCULAR | Status: AC
  Administered 2023-09-27: 10 mL via INTRA_ARTICULAR
